# Patient Record
Sex: Female | Born: 1970 | Race: White | Hispanic: No | Marital: Married | Smoking: Never smoker
Health system: Southern US, Community
[De-identification: ages and names within clinical notes are randomized; demographics above are authoritative.]

## PROBLEM LIST (undated history)

## (undated) DIAGNOSIS — R112 Nausea with vomiting, unspecified: Secondary | ICD-10-CM

## (undated) DIAGNOSIS — Z9889 Other specified postprocedural states: Secondary | ICD-10-CM

## (undated) DIAGNOSIS — I10 Essential (primary) hypertension: Secondary | ICD-10-CM

## (undated) DIAGNOSIS — R011 Cardiac murmur, unspecified: Secondary | ICD-10-CM

---

## 2016-08-01 DIAGNOSIS — I10 Essential (primary) hypertension: Secondary | ICD-10-CM | POA: Insufficient documentation

## 2017-01-17 ENCOUNTER — Other Ambulatory Visit: Payer: Self-pay | Admitting: Student

## 2017-01-17 DIAGNOSIS — Z1231 Encounter for screening mammogram for malignant neoplasm of breast: Secondary | ICD-10-CM

## 2017-01-29 ENCOUNTER — Ambulatory Visit
Admission: RE | Admit: 2017-01-29 | Discharge: 2017-01-29 | Disposition: A | Discharge status: Home / Self Care | Payer: BLUE CROSS/BLUE SHIELD | Source: Ambulatory Visit | Attending: Student | Admitting: Student

## 2017-01-29 ENCOUNTER — Encounter: Payer: Self-pay | Admitting: Radiology

## 2017-01-29 DIAGNOSIS — Z1231 Encounter for screening mammogram for malignant neoplasm of breast: Secondary | ICD-10-CM | POA: Insufficient documentation

## 2018-04-09 ENCOUNTER — Other Ambulatory Visit: Payer: Self-pay | Admitting: Student

## 2018-04-09 DIAGNOSIS — Z1231 Encounter for screening mammogram for malignant neoplasm of breast: Secondary | ICD-10-CM

## 2018-04-29 ENCOUNTER — Ambulatory Visit
Admission: RE | Admit: 2018-04-29 | Discharge: 2018-04-29 | Disposition: A | Discharge status: Home / Self Care | Payer: BC Managed Care – PPO | Source: Ambulatory Visit | Attending: Student | Admitting: Student

## 2018-04-29 DIAGNOSIS — Z1231 Encounter for screening mammogram for malignant neoplasm of breast: Secondary | ICD-10-CM | POA: Diagnosis not present

## 2018-04-30 ENCOUNTER — Other Ambulatory Visit: Payer: Self-pay | Admitting: Student

## 2018-04-30 DIAGNOSIS — R928 Other abnormal and inconclusive findings on diagnostic imaging of breast: Secondary | ICD-10-CM

## 2018-04-30 DIAGNOSIS — N6320 Unspecified lump in the left breast, unspecified quadrant: Secondary | ICD-10-CM

## 2018-05-28 ENCOUNTER — Ambulatory Visit: Payer: BC Managed Care – PPO | Attending: Student

## 2018-05-28 ENCOUNTER — Other Ambulatory Visit: Payer: BC Managed Care – PPO

## 2018-06-04 NOTE — Addendum Note (Signed)
Encounter addended by: Nona Dell, RT on: 06/04/2018 5:58 PM  Actions taken: Imaging Exam ended

## 2018-06-06 ENCOUNTER — Ambulatory Visit
Admission: RE | Admit: 2018-06-06 | Discharge: 2018-06-06 | Disposition: A | Discharge status: Home / Self Care | Payer: BC Managed Care – PPO | Source: Ambulatory Visit | Attending: Student | Admitting: Student

## 2018-06-06 DIAGNOSIS — R928 Other abnormal and inconclusive findings on diagnostic imaging of breast: Secondary | ICD-10-CM

## 2018-06-06 DIAGNOSIS — N6320 Unspecified lump in the left breast, unspecified quadrant: Secondary | ICD-10-CM | POA: Insufficient documentation

## 2018-06-09 ENCOUNTER — Other Ambulatory Visit: Payer: Self-pay | Admitting: Student

## 2018-06-09 DIAGNOSIS — R928 Other abnormal and inconclusive findings on diagnostic imaging of breast: Secondary | ICD-10-CM

## 2018-06-09 DIAGNOSIS — N6320 Unspecified lump in the left breast, unspecified quadrant: Secondary | ICD-10-CM

## 2018-06-17 ENCOUNTER — Ambulatory Visit
Admission: RE | Admit: 2018-06-17 | Discharge: 2018-06-17 | Disposition: A | Discharge status: Home / Self Care | Payer: BC Managed Care – PPO | Source: Ambulatory Visit | Attending: Student | Admitting: Student

## 2018-06-17 DIAGNOSIS — N6320 Unspecified lump in the left breast, unspecified quadrant: Secondary | ICD-10-CM | POA: Diagnosis present

## 2018-06-17 DIAGNOSIS — R928 Other abnormal and inconclusive findings on diagnostic imaging of breast: Secondary | ICD-10-CM

## 2018-06-17 HISTORY — PX: BREAST BIOPSY: SHX20

## 2018-06-18 LAB — SURGICAL PATHOLOGY

## 2018-07-21 ENCOUNTER — Ambulatory Visit: Payer: Self-pay | Admitting: Surgery

## 2018-07-21 NOTE — H&P (Signed)
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 female who was referred by Lendon Colonel, PA for evaluation of above. Change was noted on last screening mammogram. Patient does routinely do self breast exams.  Patient has not noted a change on breast exam. Age of menarche was 27. Last menstrual period was 06/14/18. Patient admits to [facility] use. Patient is G3P3.  Patient did breast feed. Patient denies nipple discharge. Patient denies previous breast biopsy. Patient denies a personal history of breast cancer.   Past Medical History:  has a past medical history of Hypertension.  Past Surgical History:  has a past surgical history that includes Cesarean section and Tubal ligation.  Family History: family history includes Alcohol abuse in her father and paternal grandfather; Dementia in her maternal grandmother; High blood pressure (Hypertension) in her father, maternal grandfather, maternal grandmother, mother, paternal grandfather, paternal grandmother, and sister; No Known Problems in her daughter, sister, son, and son.  Social History:  reports that she has never smoked. She has never used smokeless tobacco. She reports that she drinks about 0.6 oz of alcohol per week. She reports that she does not use drugs.  Current Medications: has a current medication list which includes the following prescription(s): lisinopril-hydrochlorothiazide and melatonin.  Allergies:     Allergies as of 07/07/2018  . (No Known Allergies)    ROS:  A 15 point review of systems was performed and was negative except as noted in HPI   Objective:   BP (!) 149/90   Pulse 78   Temp 36.4 C (97.6 F) (Oral)   Ht 167.6 cm ('5\' 6"'$ )   Wt 97.1 kg (214 lb)   BMI 34.54 kg/m   Constitutional :  alert, appears stated age, cooperative and no distress  Lymphatics/Throat:  no asymmetry, masses, or scars  Respiratory:  clear to auscultation bilaterally  Cardiovascular:  regular  rate and rhythm  Gastrointestinal: soft, non-tender; bowel sounds normal; no masses,  no organomegaly.   Musculoskeletal: Steady gait and movement  Skin: Cool and moist,   Psychiatric: Normal affect, non-agitated, not confused  Breast:  Chaperone present for exam.  breasts appear normal, no suspicious masses, no skin or nipple changes or axillary nodes.    LABS/RADS:  Surgical pathology (06/17/2018 1:38 PM EDT)       Surgical pathology (06/17/2018 1:38 PM EDT)  Component Value Ref Range Performed At Pathologist Signature  SURGICAL PATHOLOGY Surgical Pathology CASE: [id number] PATIENT: Lisa Roach Surgical Pathology Report     SPECIMEN SUBMITTED: A. Breast, left, 3 o'clock 1 cm from nipple; biopsy  CLINICAL HISTORY: Screening detected mixed cystic and sold mass involving the outer subareolar left breast at the 3:00 location.The solid component (sampled today) measures approx. 1.4 cm.The entire mass measures approx. 2.6 cm  PRE-OPERATIVE DIAGNOSIS: Papilloma vs papillary cancer vs mucinous lesion vs FCC with complex cysts vs DCIS/IMC  POST-OPERATIVE DIAGNOSIS: None provided.     DIAGNOSIS: A. BREAST, LEFT, OUTER SUBAREOLAR, 3 O'CLOCK 1 CM FROM NIPPLE; ULTRASOUND-GUIDED CORE BIOPSY: - SCLEROSING INTRADUCTAL PROLIFERATION WITH PAPILLOMATOUS FEATURES, PREDOMINANT APOCRINE HYPERPLASIA, AND FOCAL USUAL DUCTAL HYPERPLASIA. - NEGATIVE FOR ATYPIA AND MALIGNANCY.   GROSS DESCRIPTION: A. Labeled: Left breast 3:00, 1 cm from nipple Received: In formalin Time/date in fixative: 1:10 PM on 06/17/2018 Cold ischemic time: Less than 1 minute Total fixation time: 7.5 hours Core pieces: Multiple Size: Aggregate, 2.2 x 0.4 x 0.1 cm Description: Yellow to pink cores and fragments Ink color:  Green Entirely submitted in one cassette.    Final Diagnosis performed by Bryan Lemma, MD. Electronically signed 06/18/2018 3:58:15PM The electronic signature indicates that the named  Attending Pathologist has evaluated the specimen Technical component performed at [facility], [address] [place], [place] [address] Lab: [phone] Dir: Rush Farmer, MD, MMMProfessional component performed at [facility], [facility], [place], [place], [place] [address] Lab: [phone] Dir: Dellia Nims. Reuel Derby, MD  [facility]    Surgical pathology (06/17/2018 1:38 PM EDT)  Specimen  Tissue - [facility] Breast biopsy        Surgical pathology (06/17/2018 1:38 PM EDT)  Performing Organization Address City/State/Zipcode Phone Number  [facility]        Imaging Results - documented in this encounter  [place] LT BREAST BX W LOC DEV 1ST LESION IMG BX SPEC US GUIDE (06/17/2018 2:23 PM EDT) [place] LT BREAST BX W LOC DEV 1ST LESION IMG BX SPEC US GUIDE (06/17/2018 2:23 PM EDT)  Specimen     [place] LT BREAST BX W LOC DEV 1ST LESION IMG BX SPEC US GUIDE (06/17/2018 2:23 PM EDT)  Addenda  Addendum by Evangeline Dakin, MD on 06/20/2018 11:57 AM  ADDENDUM REPORT: 06/20/2018 11:05    ADDENDUM:  Pathology of the left breast biopsy revealed A. BREAST, LEFT, OUTER  SUBAREOLAR, 3 O'CLOCK 1 CM FROM NIPPLE; ULTRASOUND-GUIDED CORE  BIOPSY: SCLEROSING INTRADUCTAL PROLIFERATION WITH PAPILLOMATOUS  FEATURES, PREDOMINANT APOCRINE HYPERPLASIA, AND FOCAL USUAL DUCTAL  HYPERPLASIA. NEGATIVE FOR ATYPIA AND MALIGNANCY.    This was found to be concordant by Dr. Purcell Nails.    RECOMMENDATION: Surgical excision.    Results and recommendations were relayed to [facility], nurse for  Lisa Denis, PA by phone and fax by Jetta Lout, [place]. She  stated that Lisa Denis, PA will contact her patient with the  results and arrange the referral. The patient was contacted by Jetta Lout, [place] on 06/19/18 at 5:15 PM for a post biopsy site check. The  patient stated she has done well following the biopsy with no  bleeding or pain. Post biopsy instructions were reviewed with the   patient and all of her questions were answered. She had not talked  to her provider at the time of conversation. I explained to her that  her provider prefers to discuss results and to expect a call from  her. I did inform the patient that it was benign and the  recommendation, but her provider would discuss it with her further.  She was encouraged to contact the [facility] with any  further questions or concerns.    Addendum by Jetta Lout, RRA on 06/20/18.      Electronically Signed  ByHaydee Monica M.D.  On: 06/20/2018 11:05         [place] LT BREAST BX W LOC DEV 1ST LESION IMG BX SPEC US GUIDE (06/17/2018 2:23 PM EDT)  Narrative Performed At  [place] detected mixed cystic and solid mass  involving the OUTER subareolar LEFT breast at the 3 o'clock  location. The solid component of the mass measures approximately 1.4  cm. The entire mass measures approximately 2.6 cm. The solid  component is sampled.    EXAM:  ULTRASOUND GUIDED LEFT BREAST CORE NEEDLE BIOPSY    COMPARISON:Previous exam(s).    FINDINGS:  I met with the patient and we discussed the procedure of  ultrasound-guided biopsy, including benefits and alternatives. We  discussed the high likelihood of a successful procedure. We  discussed the risks of  the procedure, including infection, bleeding,  tissue injury, clip migration, and inadequate sampling. Informed  written consent was given. The usual time-out protocol was performed  immediately prior to the procedure.    Lesion quadrant: Slight LOWER OUTER QUADRANT.    Using sterile technique with chlorhexidine as skin antisepsis, 1%  Lidocaine as local anesthetic, under direct ultrasound  visualization, a 12 gauge Bard Marquee core needle device was used  to perform biopsy of the solid component of a mixed cystic and solid  mass in the OUTER subareolar LEFT breast using a LATERAL  approach.  At the conclusion of the procedure a coil shaped tissue marker clip  was deployed into the biopsy cavity. Follow up 2 view mammogram was  performed and dictated separately.    IMPRESSION:  Ultrasound guided biopsy of the solid component of a mixed cystic  and solid mass in the OUTER subareolar LEFT breast. No apparent  complications.    Electronically Signed:  ByHaydee Monica M.D.  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 The risks of general anesthetic, if used, includes MI, CVA, sudden death or even reaction to anesthetic medications also discussed.  Typical post-op recovery time and possbility of activity restrictions were also discussed.  Alternatives include continued observation.  Benefits include possible symptom relief, pathologic evaluation, and/or curative excision.   The patient verbalized understanding and all questions were answered to the patient's satisfaction.  2. We discussed the possibility of non-biopsied DCIS, atypia, or more serious malignancies within the area of concern on mammography.  Current guideline recommendation is proceeding with a full excisional biopsy of the suspected area but there is some emerging literature saying close follow-up may be appropriate for certain individuals.  After extensive discussion of such, patient specifically stated that cost is a major factor for her and deciding whether or not to proceed with surgery.  She requested that preliminary numbers for cost versus continued surveillance is collected prior to making final decision.  We provided her with the CPT codes necessary for her to inquire with her insurance company.   She will follow-up with [place] when she makes a final decision regarding surveillance versus proceeding with excisional biopsy.      Electronically signed by Benjamine Sprague, DO on 07/08/2018 9:21 PM

## 2018-07-22 ENCOUNTER — Other Ambulatory Visit: Payer: Self-pay | Admitting: Surgery

## 2018-07-22 DIAGNOSIS — D242 Benign neoplasm of left breast: Secondary | ICD-10-CM

## 2018-08-14 ENCOUNTER — Encounter
Admission: RE | Admit: 2018-08-14 | Discharge: 2018-08-14 | Disposition: A | Discharge status: Home / Self Care | Payer: BC Managed Care – PPO | Source: Ambulatory Visit | Attending: Surgery | Admitting: Surgery

## 2018-08-14 ENCOUNTER — Other Ambulatory Visit: Payer: Self-pay

## 2018-08-14 HISTORY — DX: Essential (primary) hypertension: I10

## 2018-08-14 HISTORY — DX: Cardiac murmur, unspecified: R01.1

## 2018-08-14 HISTORY — DX: Nausea with vomiting, unspecified: Z98.890

## 2018-08-14 HISTORY — DX: Nausea with vomiting, unspecified: R11.2

## 2018-08-14 NOTE — Patient Instructions (Addendum)
  Your procedure is scheduled on: Thursday August 28, 2018 @ 7:45 am Report to [facility]  Remember: Instructions that are not followed completely may result in serious medical risk, up to and including death, or upon the discretion of your surgeon and anesthesiologist your surgery may need to be rescheduled.    __x__ 1. Do not eat food (including mints, candies, chewing gum) after midnight the night before your procedure. You may drink clear liquids up to 2 hours before you are scheduled to arrive at the hospital for your procedure.  Do not drink anything within 2 hours of your scheduled arrival to the hospital.  Approved clear liquids:  --Water or Apple juice without pulp  --Clear carbohydrate beverage such as Gatorade or Powerade  --Black Coffee or Clear Tea (No milk, no creamers, do not add anything to the coffee or tea)    __x__ 2. No Alcohol for 24 hours before or after surgery.   __x__ 3. No Smoking or e-cigarettes for 24 hours before surgery.  Do not use any chewable tobacco products for at least 6 hours before surgery.   __x__ 4. Notify your doctor if there is any change in your medical condition (cold, fever, infections).   __x__ 5. On the morning of surgery brush your teeth with toothpaste and water.  You may rinse your mouth with mouthwash if you wish.  Do not swallow any toothpaste or mouthwash.  Please read over the following fact sheets that you were given:   [facility] Preparing for Surgery and/or MRSA Information    __x__ Use CHG Soap or Sage wipes as directed on instruction sheet.   Do not wear jewelry, make-up, hairpins, clips or nail polish on the day of surgery.  Do not wear lotions, powders, deodorant, or perfumes.   Do not shave below the face/neck 48 hours prior to surgery.   Do not bring valuables to the hospital.    [facility] is not responsible for any belongings or valuables.               Contacts, dentures or bridgework may not be worn  into surgery.  For patients discharged on the day of surgery, you will NOT be permitted to drive yourself home.  You must have a responsible adult with you for 24 hours after surgery.  __x__ Take the following medicines on the morning of surgery with a SMALL SIP OF WATER: None  __x__ Follow recommendations from Cardiologist, Pulmonologist or PCP regarding stopping Aspirin, Coumadin, Plavix, Eliquis, Effient, Pradaxa, and Pletal.  __x__ TODAY: Stop Anti-inflammatories such as Advil, Ibuprofen, Motrin, Aleve, Naproxen, Naprosyn, BC/Goodies powders or aspirin products. You may continue to take Tylenol and Celebrex.   __x__ TODAY: Stop supplements until after surgery. You may continue to take Vitamin D, Vitamin B, and multivitamin.

## 2018-08-21 ENCOUNTER — Encounter
Admission: RE | Admit: 2018-08-21 | Discharge: 2018-08-21 | Disposition: A | Discharge status: Home / Self Care | Payer: BC Managed Care – PPO | Source: Ambulatory Visit | Attending: Surgery | Admitting: Surgery

## 2018-08-21 DIAGNOSIS — Z01818 Encounter for other preprocedural examination: Secondary | ICD-10-CM | POA: Insufficient documentation

## 2018-08-21 DIAGNOSIS — D242 Benign neoplasm of left breast: Secondary | ICD-10-CM | POA: Diagnosis not present

## 2018-08-21 DIAGNOSIS — R9431 Abnormal electrocardiogram [ECG] [EKG]: Secondary | ICD-10-CM | POA: Insufficient documentation

## 2018-08-21 DIAGNOSIS — I10 Essential (primary) hypertension: Secondary | ICD-10-CM | POA: Insufficient documentation

## 2018-08-21 LAB — CBC
HCT: 36.4 % (ref 36.0–46.0)
Hemoglobin: 12.2 g/dL (ref 12.0–15.0)
MCH: 29 pg (ref 26.0–34.0)
MCHC: 33.5 g/dL (ref 30.0–36.0)
MCV: 86.7 fL (ref 80.0–100.0)
PLATELETS: 352 10*3/uL (ref 150–400)
RBC: 4.2 MIL/uL (ref 3.87–5.11)
RDW: 12.1 % (ref 11.5–15.5)
WBC: 9 10*3/uL (ref 4.0–10.5)
nRBC: 0 % (ref 0.0–0.2)

## 2018-08-27 MED ORDER — CEFAZOLIN SODIUM-DEXTROSE 2-4 GM/100ML-% IV SOLN
2.0000 g | INTRAVENOUS | Status: AC
  Administered 2018-08-28: 2 g via INTRAVENOUS

## 2018-08-28 ENCOUNTER — Ambulatory Visit
Admission: RE | Admit: 2018-08-28 | Discharge: 2018-08-28 | Disposition: A | Discharge status: Home / Self Care | Payer: BC Managed Care – PPO | Source: Ambulatory Visit | Attending: Surgery | Admitting: Surgery

## 2018-08-28 ENCOUNTER — Ambulatory Visit: Payer: BC Managed Care – PPO | Admitting: Anesthesiology

## 2018-08-28 ENCOUNTER — Encounter
Admission: RE | Disposition: A | Discharge status: Home / Self Care | Payer: Self-pay | Source: Ambulatory Visit | Attending: Surgery

## 2018-08-28 DIAGNOSIS — D241 Benign neoplasm of right breast: Secondary | ICD-10-CM

## 2018-08-28 DIAGNOSIS — D242 Benign neoplasm of left breast: Secondary | ICD-10-CM | POA: Diagnosis not present

## 2018-08-28 DIAGNOSIS — N6082 Other benign mammary dysplasias of left breast: Secondary | ICD-10-CM | POA: Diagnosis not present

## 2018-08-28 DIAGNOSIS — Z79899 Other long term (current) drug therapy: Secondary | ICD-10-CM | POA: Diagnosis not present

## 2018-08-28 DIAGNOSIS — I10 Essential (primary) hypertension: Secondary | ICD-10-CM | POA: Insufficient documentation

## 2018-08-28 HISTORY — PX: BREAST LUMPECTOMY WITH NEEDLE LOCALIZATION: SHX5759

## 2018-08-28 HISTORY — PX: BREAST LUMPECTOMY: SHX2

## 2018-08-28 LAB — POCT PREGNANCY, URINE: Preg Test, Ur: NEGATIVE

## 2018-08-28 LAB — POCT I-STAT 4, (NA,K, GLUC, HGB,HCT)
GLUCOSE: 117 mg/dL — AB (ref 70–99)
HEMATOCRIT: 37 % (ref 36.0–46.0)
HEMOGLOBIN: 12.6 g/dL (ref 12.0–15.0)
POTASSIUM: 3.4 mmol/L — AB (ref 3.5–5.1)
Sodium: 140 mmol/L (ref 135–145)

## 2018-08-28 SURGERY — BREAST LUMPECTOMY WITH NEEDLE LOCALIZATION
Anesthesia: General | Site: Breast | Laterality: Left

## 2018-08-28 MED ORDER — FENTANYL CITRATE (PF) 100 MCG/2ML IJ SOLN
INTRAMUSCULAR | Status: DC | PRN
  Administered 2018-08-28 (×2): 50 ug via INTRAVENOUS

## 2018-08-28 MED ORDER — IBUPROFEN 800 MG PO TABS: 800.0000 mg | ORAL_TABLET | Freq: Three times a day (TID) | ORAL | 0 refills | Status: DC | PRN

## 2018-08-28 MED ORDER — FAMOTIDINE 20 MG PO TABS
ORAL_TABLET | ORAL | Status: AC
  Administered 2018-08-28: 20 mg via ORAL
  Filled 2018-08-28: qty 1

## 2018-08-28 MED ORDER — LIDOCAINE HCL (PF) 1 % IJ SOLN
INTRAMUSCULAR | Status: AC
  Filled 2018-08-28: qty 30

## 2018-08-28 MED ORDER — OXYCODONE HCL 5 MG/5ML PO SOLN: 5.0000 mg | Freq: Once | ORAL | Status: DC | PRN

## 2018-08-28 MED ORDER — GABAPENTIN 300 MG PO CAPS
ORAL_CAPSULE | ORAL | Status: AC
  Administered 2018-08-28: 300 mg via ORAL
  Filled 2018-08-28: qty 1

## 2018-08-28 MED ORDER — ONDANSETRON HCL 4 MG/2ML IJ SOLN
INTRAMUSCULAR | Status: DC | PRN
  Administered 2018-08-28: 4 mg via INTRAVENOUS

## 2018-08-28 MED ORDER — LIDOCAINE HCL (CARDIAC) PF 100 MG/5ML IV SOSY
PREFILLED_SYRINGE | INTRAVENOUS | Status: DC | PRN
  Administered 2018-08-28: 80 mg via INTRAVENOUS

## 2018-08-28 MED ORDER — PROPOFOL 10 MG/ML IV BOLUS
INTRAVENOUS | Status: DC | PRN
  Administered 2018-08-28: 150 mg via INTRAVENOUS

## 2018-08-28 MED ORDER — MIDAZOLAM HCL 5 MG/5ML IJ SOLN
INTRAMUSCULAR | Status: AC
  Filled 2018-08-28: qty 5

## 2018-08-28 MED ORDER — BUPIVACAINE-EPINEPHRINE (PF) 0.5% -1:200000 IJ SOLN
INTRAMUSCULAR | Status: AC
  Filled 2018-08-28: qty 30

## 2018-08-28 MED ORDER — GABAPENTIN 300 MG PO CAPS
300.0000 mg | ORAL_CAPSULE | ORAL | Status: AC
  Administered 2018-08-28: 300 mg via ORAL

## 2018-08-28 MED ORDER — PROMETHAZINE HCL 25 MG/ML IJ SOLN: 12.5000 mg | INTRAMUSCULAR | Status: DC | PRN

## 2018-08-28 MED ORDER — CELECOXIB 200 MG PO CAPS
200.0000 mg | ORAL_CAPSULE | ORAL | Status: AC
  Administered 2018-08-28: 200 mg via ORAL

## 2018-08-28 MED ORDER — ACETAMINOPHEN 325 MG PO TABS: 650.0000 mg | ORAL_TABLET | Freq: Three times a day (TID) | ORAL | 0 refills | Status: AC | PRN

## 2018-08-28 MED ORDER — DEXAMETHASONE SODIUM PHOSPHATE 10 MG/ML IJ SOLN
INTRAMUSCULAR | Status: DC | PRN
  Administered 2018-08-28: 5 mg via INTRAVENOUS

## 2018-08-28 MED ORDER — LACTATED RINGERS IV SOLN
INTRAVENOUS | Status: DC
  Administered 2018-08-28: 09:00:00 via INTRAVENOUS

## 2018-08-28 MED ORDER — FENTANYL CITRATE (PF) 100 MCG/2ML IJ SOLN
INTRAMUSCULAR | Status: AC
  Filled 2018-08-28: qty 2

## 2018-08-28 MED ORDER — LIDOCAINE-EPINEPHRINE (PF) 1 %-1:200000 IJ SOLN
INTRAMUSCULAR | Status: DC | PRN
  Administered 2018-08-28: 10 mL

## 2018-08-28 MED ORDER — FENTANYL CITRATE (PF) 100 MCG/2ML IJ SOLN: 25.0000 ug | INTRAMUSCULAR | Status: DC | PRN

## 2018-08-28 MED ORDER — ONDANSETRON HCL 4 MG/2ML IJ SOLN
INTRAMUSCULAR | Status: AC
  Filled 2018-08-28: qty 2

## 2018-08-28 MED ORDER — DEXAMETHASONE SODIUM PHOSPHATE 10 MG/ML IJ SOLN
INTRAMUSCULAR | Status: AC
  Filled 2018-08-28: qty 1

## 2018-08-28 MED ORDER — BUPIVACAINE HCL 0.5 % IJ SOLN
INTRAMUSCULAR | Status: DC | PRN
  Administered 2018-08-28: 10 mL

## 2018-08-28 MED ORDER — ACETAMINOPHEN 500 MG PO TABS
ORAL_TABLET | ORAL | Status: AC
  Administered 2018-08-28: 1000 mg via ORAL
  Filled 2018-08-28: qty 2

## 2018-08-28 MED ORDER — EPHEDRINE SULFATE 50 MG/ML IJ SOLN
INTRAMUSCULAR | Status: DC | PRN
  Administered 2018-08-28 (×4): 5 mg via INTRAVENOUS

## 2018-08-28 MED ORDER — SCOPOLAMINE 1 MG/3DAYS TD PT72
1.0000 | MEDICATED_PATCH | Freq: Once | TRANSDERMAL | Status: DC
  Administered 2018-08-28: 1.5 mg via TRANSDERMAL

## 2018-08-28 MED ORDER — HYDROCODONE-ACETAMINOPHEN 5-325 MG PO TABS: 1.0000 | ORAL_TABLET | Freq: Four times a day (QID) | ORAL | 0 refills | Status: AC | PRN

## 2018-08-28 MED ORDER — MIDAZOLAM HCL 2 MG/2ML IJ SOLN
INTRAMUSCULAR | Status: DC | PRN
  Administered 2018-08-28: 2 mg via INTRAVENOUS

## 2018-08-28 MED ORDER — PROPOFOL 10 MG/ML IV BOLUS
INTRAVENOUS | Status: AC
  Filled 2018-08-28: qty 20

## 2018-08-28 MED ORDER — ACETAMINOPHEN 500 MG PO TABS
1000.0000 mg | ORAL_TABLET | ORAL | Status: AC
  Administered 2018-08-28: 1000 mg via ORAL

## 2018-08-28 MED ORDER — DOCUSATE SODIUM 100 MG PO CAPS: 100.0000 mg | ORAL_CAPSULE | Freq: Two times a day (BID) | ORAL | 0 refills | Status: AC | PRN

## 2018-08-28 MED ORDER — CELECOXIB 200 MG PO CAPS
ORAL_CAPSULE | ORAL | Status: AC
  Administered 2018-08-28: 200 mg via ORAL
  Filled 2018-08-28: qty 1

## 2018-08-28 MED ORDER — CEFAZOLIN SODIUM-DEXTROSE 2-4 GM/100ML-% IV SOLN
INTRAVENOUS | Status: AC
  Filled 2018-08-28: qty 100

## 2018-08-28 MED ORDER — OXYCODONE HCL 5 MG PO TABS: 5.0000 mg | ORAL_TABLET | Freq: Once | ORAL | Status: DC | PRN

## 2018-08-28 MED ORDER — SCOPOLAMINE 1 MG/3DAYS TD PT72
MEDICATED_PATCH | TRANSDERMAL | Status: AC
  Administered 2018-08-28: 1.5 mg via TRANSDERMAL
  Filled 2018-08-28: qty 1

## 2018-08-28 MED ORDER — CHLORHEXIDINE GLUCONATE CLOTH 2 % EX PADS
6.0000 | MEDICATED_PAD | Freq: Once | CUTANEOUS | Status: AC
  Administered 2018-08-28: 6 via TOPICAL

## 2018-08-28 MED ORDER — FAMOTIDINE 20 MG PO TABS
20.0000 mg | ORAL_TABLET | Freq: Once | ORAL | Status: AC
  Administered 2018-08-28: 20 mg via ORAL

## 2018-08-28 SURGICAL SUPPLY — 44 items
APPLIER CLIP 11 MED OPEN (CLIP)
BLADE SURG 15 STRL LF DISP TIS (BLADE) ×1 IMPLANT
BLADE SURG 15 STRL SS (BLADE) ×2
CANISTER SUCT 1200ML W/VALVE (MISCELLANEOUS) ×3 IMPLANT
CHLORAPREP W/TINT 26ML (MISCELLANEOUS) ×3 IMPLANT
CLIP APPLIE 11 MED OPEN (CLIP) IMPLANT
CNTNR SPEC 2.5X3XGRAD LEK (MISCELLANEOUS) ×1
CONT SPEC 4OZ STER OR WHT (MISCELLANEOUS) ×2
CONTAINER SPEC 2.5X3XGRAD LEK (MISCELLANEOUS) ×1 IMPLANT
COVER WAND RF STERILE (DRAPES) ×3 IMPLANT
DERMABOND ADVANCED (GAUZE/BANDAGES/DRESSINGS) ×2
DERMABOND ADVANCED .7 DNX12 (GAUZE/BANDAGES/DRESSINGS) ×1 IMPLANT
DEVICE DUBIN SPECIMEN MAMMOGRA (MISCELLANEOUS) ×3 IMPLANT
DRAPE CHEST BREAST 77X106 FENE (MISCELLANEOUS) IMPLANT
DRAPE LAPAROTOMY 77X122 PED (DRAPES) ×3 IMPLANT
DRAPE SHEET LG 3/4 BI-LAMINATE (DRAPES) ×3 IMPLANT
ELECT CAUTERY BLADE TIP 2.5 (TIP) ×3
ELECT REM PT RETURN 9FT ADLT (ELECTROSURGICAL) ×3
ELECTRODE CAUTERY BLDE TIP 2.5 (TIP) ×1 IMPLANT
ELECTRODE REM PT RTRN 9FT ADLT (ELECTROSURGICAL) ×1 IMPLANT
GAUZE SPONGE 4X4 12PLY STRL (GAUZE/BANDAGES/DRESSINGS) ×3 IMPLANT
GLOVE BIOGEL PI IND STRL 7.0 (GLOVE) ×1 IMPLANT
GLOVE BIOGEL PI INDICATOR 7.0 (GLOVE) ×2
GLOVE SURG SYN 7.0 (GLOVE) ×6 IMPLANT
GOWN STRL REUS W/ TWL LRG LVL3 (GOWN DISPOSABLE) ×3 IMPLANT
GOWN STRL REUS W/TWL LRG LVL3 (GOWN DISPOSABLE) ×6
JACKSON PRATT 10 (INSTRUMENTS) IMPLANT
KIT TURNOVER KIT A (KITS) ×3 IMPLANT
LABEL OR SOLS (LABEL) ×3 IMPLANT
LIGHT WAVEGUIDE WIDE FLAT (MISCELLANEOUS) IMPLANT
NEEDLE HYPO 25X1 1.5 SAFETY (NEEDLE) ×6 IMPLANT
PACK BASIN MINOR ARMC (MISCELLANEOUS) ×3 IMPLANT
SUT MNCRL 4-0 (SUTURE) ×4
SUT MNCRL 4-0 27XMFL (SUTURE) ×2
SUT SILK 2 0 (SUTURE) ×2
SUT SILK 2-0 30XBRD TIE 12 (SUTURE) ×1 IMPLANT
SUT SILK 3 0 12 30 (SUTURE) ×3 IMPLANT
SUT VIC AB 3-0 SH 27 (SUTURE) ×4
SUT VIC AB 3-0 SH 27X BRD (SUTURE) ×2 IMPLANT
SUTURE MNCRL 4-0 27XMF (SUTURE) ×2 IMPLANT
SYR 10ML LL (SYRINGE) ×3 IMPLANT
SYR 50ML LL SCALE MARK (SYRINGE) IMPLANT
SYR BULB IRRIG 60ML STRL (SYRINGE) ×3 IMPLANT
WATER STERILE IRR 1000ML POUR (IV SOLUTION) ×3 IMPLANT

## 2018-08-28 NOTE — Op Note (Signed)
Preoperative diagnosis:  Left breast papilloma.  Postoperative diagnosis: Left breast papilloma.   Procedure: needle-localized left breast lumpectomy.   Anesthesia: GETA  Surgeon: Dr. Benjamine Sprague  Wound Classification: Clean  Indications: Patient is a 47 y.o. female with a left breast mass noted on mammography with core biopsy demonstrating papilloma requiring needle-localized lumpectomy  Specimen: left Breast mass  Complications: None  Estimated Blood Loss: 15mL  Findings: 1. Specimen mammography shows marker and wire on specimen 2. Pathology call refers gross examination of margins was negative    Description of procedure: Preoperative needle localization was performed by radiology. Localization studies were reviewed. The patient was taken to the operating room and placed supine on the operating table, and after general anesthesia the left breast  prepped and draped in the usual sterile fashion. A time-out was completed verifying correct patient, procedure, site, positioning, and implant(s) and/or special equipment prior to beginning this procedure.  By comparing the localization studies with the direction and skin entry site of the needle, the probable trajectory and location of the mass was visualized. A circumareolar skin incision was planned in such a way as to minimize the amount of dissection to reach the mass.  The skin incision was made after infusion of local. Flaps were raised and the location of the wire confirmed. The wire was delivered into the wound. Sharp and blunt dissection was then taken down to the mass within breast tissue, measuring 3cm x 3cm x 2.5cm, taking care to include the entire localizing needle and a margin of grossly normal tissue. The specimen and entire localizing wire were removed. The specimen was oriented with long lateral, short superior, deep double sutures and sent to radiology with the localization studies. Confirmation was received that the entire  target lesion had been resected.  Wound irrigated, hemostasis was achieved and the wound closed in layers with  interrupted sutures of 3-0 Vicryl in deep dermal layer and a running subcuticular suture of Monocryl 4-0, then dressed with dermabond.  The patient tolerated the procedure well and was taken to the postanesthesia care unit in stable condition. Sponge and instrument count correct at end of procedure.

## 2018-08-28 NOTE — Anesthesia Procedure Notes (Signed)
Procedure Name: LMA Insertion Date/Time: 08/28/2018 10:35 AM Performed by: Hedda Slade, CRNA Pre-anesthesia Checklist: Patient identified, Patient being monitored, Timeout performed, Emergency Drugs available and Suction available Patient Re-evaluated:Patient Re-evaluated prior to induction Oxygen Delivery Method: Circle system utilized Preoxygenation: Pre-oxygenation with 100% oxygen Induction Type: IV induction Ventilation: Mask ventilation without difficulty LMA: LMA inserted LMA Size: 3.5 Tube type: Oral Number of attempts: 1 Placement Confirmation: positive ETCO2 and breath sounds checked- equal and bilateral Tube secured with: Tape Dental Injury: Teeth and Oropharynx as per pre-operative assessment

## 2018-08-28 NOTE — H&P (Signed)
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 female who was referred by Lendon Colonel, PA for evaluation of above. Change was noted on last screening mammogram. Patient does routinely do self breast exams. Patient has not noted a change on breast exam. Age of menarche was 9. Last menstrual period was 06/14/18. Patient admits to [facility] use. Patient is G3P3. Patient did breast feed. Patient denies nipple discharge. Patient denies previous breast biopsy. Patient denies a personal history of breast cancer.  Past Medical History: has a past medical history of Hypertension.  Past Surgical History: has a past surgical history that includes Cesarean section and Tubal ligation.  Family History: family history includes Alcohol abuse in her father and paternal grandfather; Dementia in her maternal grandmother; High blood pressure (Hypertension) in her father, maternal grandfather, maternal grandmother, mother, paternal grandfather, paternal grandmother, and sister; No Known Problems in her daughter, sister, son, and son.  Social History: reports that she has never smoked. She has never used smokeless tobacco. She reports that she drinks about 0.6 oz of alcohol per week. She reports that she does not use drugs.  Current Medications: has a current medication list which includes the following prescription(s): lisinopril-hydrochlorothiazide and melatonin.  Allergies:  Allergies as of 07/07/2018  . (No Known Allergies)   ROS:  A 15 point review of systems was performed and was negative except as noted in HPI  Objective:    BP (!) 149/90  Pulse 78  Temp 36.4 C (97.6 F) (Oral)  Ht 167.6 cm ('5\' 6"'$ )  Wt 97.1 kg (214 lb)  BMI 34.54 kg/m   Constitutional : alert, appears stated age, cooperative and no distress  Lymphatics/Throat: no asymmetry, masses, or scars  Respiratory: clear to auscultation bilaterally  Cardiovascular: regular rate and rhythm   Gastrointestinal: soft, non-tender; bowel sounds normal; no masses, no organomegaly.  Musculoskeletal: Steady gait and movement  Skin: Cool and moist,  Psychiatric: Normal affect, non-agitated, not confused  Breast: Chaperone present for exam. breasts appear normal, no suspicious masses, no skin or nipple changes or axillary nodes.   LABS/RADS:  Surgical pathology (06/17/2018 1:38 PM EDT) Surgical pathology (06/17/2018 1:38 PM EDT)  Component Value Ref Range Performed At Pathologist Signature  SURGICAL PATHOLOGY Surgical Pathology CASE: [id number] PATIENT: Lisa Roach Surgical Pathology Report  SPECIMEN SUBMITTED: A. Breast, left, 3 o'clock 1 cm from nipple; biopsy  CLINICAL HISTORY: Screening detected mixed cystic and sold mass involving the outer subareolar left breast at the 3:00 location.The solid component (sampled today) measures approx. 1.4 cm.The entire mass measures approx. 2.6 cm  PRE-OPERATIVE DIAGNOSIS: Papilloma vs papillary cancer vs mucinous lesion vs FCC with complex cysts vs DCIS/IMC  POST-OPERATIVE DIAGNOSIS: None provided.  DIAGNOSIS: A. BREAST, LEFT, OUTER SUBAREOLAR, 3 O'CLOCK 1 CM FROM NIPPLE; ULTRASOUND-GUIDED CORE BIOPSY: - SCLEROSING INTRADUCTAL PROLIFERATION WITH PAPILLOMATOUS FEATURES, PREDOMINANT APOCRINE HYPERPLASIA, AND FOCAL USUAL DUCTAL HYPERPLASIA. - NEGATIVE FOR ATYPIA AND MALIGNANCY.  GROSS DESCRIPTION: A. Labeled: Left breast 3:00, 1 cm from nipple Received: In formalin Time/date in fixative: 1:10 PM on 06/17/2018 Cold ischemic time: Less than 1 minute Total fixation time: 7.5 hours Core pieces: Multiple Size: Aggregate, 2.2 x 0.4 x 0.1 cm Description: Yellow to pink cores and fragments Ink color: Green Entirely submitted in one cassette.  Final Diagnosis performed by Bryan Lemma, MD. Electronically signed 06/18/2018 3:58:15PM The electronic signature indicates that the named Attending Pathologist has evaluated the  specimen Technical component performed at [place], [place]  [address] [place], [place] [address] Lab: [phone] Dir: Rush Farmer, MD, MMMProfessional component performed at [facility], [facility], [place], [place], [place] [address] Lab: [phone] Dir: Dellia Nims. Reuel Derby, MD  [facility]    Surgical pathology (06/17/2018 1:38 PM EDT)  Specimen  Tissue - [facility] Breast biopsy   Surgical pathology (06/17/2018 1:38 PM EDT)  Performing Organization Address City/State/Zipcode Phone Number  [facility]     Imaging Results - documented in this encounter  [place] LT BREAST BX W LOC DEV 1ST LESION IMG BX SPEC US GUIDE (06/17/2018 2:23 PM EDT) [place] LT BREAST BX W LOC DEV 1ST LESION IMG BX SPEC US GUIDE (06/17/2018 2:23 PM EDT)  Specimen     [place] LT BREAST BX W LOC DEV 1ST LESION IMG BX SPEC US GUIDE (06/17/2018 2:23 PM EDT)  Addenda  Addendum by Evangeline Dakin, MD on 06/20/2018 11:57 AM  ADDENDUM REPORT: 06/20/2018 11:05    ADDENDUM:  Pathology of the left breast biopsy revealed A. BREAST, LEFT, OUTER  SUBAREOLAR, 3 O'CLOCK 1 CM FROM NIPPLE; ULTRASOUND-GUIDED CORE  BIOPSY: SCLEROSING INTRADUCTAL PROLIFERATION WITH PAPILLOMATOUS  FEATURES, PREDOMINANT APOCRINE HYPERPLASIA, AND FOCAL USUAL DUCTAL  HYPERPLASIA. NEGATIVE FOR ATYPIA AND MALIGNANCY.    This was found to be concordant by Dr. Purcell Nails.    RECOMMENDATION: Surgical excision.    Results and recommendations were relayed to [facility], nurse for  Wayland Denis, PA by phone and fax by Jetta Lout, [place]. She  stated that Wayland Denis, PA will contact her patient with the  results and arrange the referral. The patient was contacted by Jetta Lout, [place] on 06/19/18 at 5:15 PM for a post biopsy site check. The  patient stated she has done well following the biopsy with no  bleeding or pain. Post biopsy instructions were reviewed with the  patient and all of her questions were answered. She had  not talked  to her provider at the time of conversation. I explained to her that  her provider prefers to discuss results and to expect a call from  her. I did inform the patient that it was benign and the  recommendation, but her provider would discuss it with her further.  She was encouraged to contact the [facility] with any  further questions or concerns.    Addendum by Jetta Lout, RRA on 06/20/18.      Electronically Signed  ByHaydee Monica M.D.  On: 06/20/2018 11:05     [place] LT BREAST BX W LOC DEV 1ST LESION IMG BX SPEC US GUIDE (06/17/2018 2:23 PM EDT)  Narrative Performed At  [facility] detected mixed cystic and solid mass  involving the OUTER subareolar LEFT breast at the 3 o'clock  location. The solid component of the mass measures approximately 1.4  cm. The entire mass measures approximately 2.6 cm. The solid  component is sampled.    EXAM:  ULTRASOUND GUIDED LEFT BREAST CORE NEEDLE BIOPSY    COMPARISON:Previous exam(s).    FINDINGS:  I met with the patient and we discussed the procedure of  ultrasound-guided biopsy, including benefits and alternatives. We  discussed the high likelihood of a successful procedure. We  discussed the risks of the procedure, including infection, bleeding,  tissue injury, clip migration, and inadequate sampling. Informed  written consent was given. The usual time-out protocol was performed  immediately prior to the procedure.    Lesion quadrant: Slight LOWER OUTER QUADRANT.    Using sterile technique with chlorhexidine as skin antisepsis, 1%  Lidocaine as local anesthetic, under direct ultrasound  visualization, a 12 gauge Bard Marquee core needle device was used  to perform biopsy of the solid component of a mixed cystic and solid  mass in the OUTER subareolar LEFT breast using a LATERAL approach.  At the conclusion of the procedure a coil shaped tissue  marker clip  was deployed into the biopsy cavity. Follow up 2 view mammogram was  performed and dictated separately.    IMPRESSION:  Ultrasound guided biopsy of the solid component of a mixed cystic  and solid mass in the OUTER subareolar LEFT breast. No apparent  complications.    Electronically Signed:  ByHaydee Monica M.D.  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 The risks of general anesthetic, if used, includes MI, CVA, sudden death or even reaction to anesthetic medications also discussed.  Typical post-op recovery time and possbility of activity restrictions were also discussed. Alternatives include continued observation. Benefits include possible symptom relief, pathologic evaluation, and/or curative excision.   The patient verbalized understanding and all questions were answered to the patient's satisfaction.  2. We discussed the possibility of non-biopsied DCIS, atypia, or more serious malignancies within the area of concern on mammography. Current guideline recommendation is proceeding with a full excisional biopsy of the suspected area but there is some emerging literature saying close follow-up may be appropriate for certain individuals. After extensive discussion of such, patient eventually decided to proceed with surgical excision.

## 2018-08-28 NOTE — Interval H&P Note (Signed)
History and Physical Interval Note:  08/28/2018 10:05 AM  Lisa Roach  has presented today for surgery, with the diagnosis of PAPILLOMA OF BREAST  The various methods of treatment have been discussed with the patient and family. After consideration of risks, benefits and other options for treatment, the patient has consented to  Procedure(s): BREAST LUMPECTOMY WITH NEEDLE LOCALIZATION (Left) as a surgical intervention .  The patient's history has been reviewed, patient examined, no change in status, stable for surgery.  I have reviewed the patient's chart and labs.  Questions were answered to the patient's satisfaction.     [name] Lysle Pearl

## 2018-08-28 NOTE — Anesthesia Postprocedure Evaluation (Signed)
Anesthesia Post Note  Patient: Lisa Roach  Procedure(s) Performed: BREAST LUMPECTOMY WITH NEEDLE LOCALIZATION (Left Breast)  Patient location during evaluation: PACU Anesthesia Type: General Level of consciousness: awake and alert Pain management: pain level controlled Vital Signs Assessment: post-procedure vital signs reviewed and stable Respiratory status: spontaneous breathing, nonlabored ventilation and respiratory function stable Cardiovascular status: blood pressure returned to baseline and stable Postop Assessment: no apparent nausea or vomiting Anesthetic complications: no     Last Vitals:  Vitals:   08/28/18 1246 08/28/18 1335  BP: 130/64 113/77  Pulse: 67 76  Resp: 16 16  Temp: 36.4 C   SpO2: 100% 97%    Last Pain:  Vitals:   08/28/18 1246  TempSrc: Temporal  PainSc: 0-No pain                 Durenda Hurt

## 2018-08-28 NOTE — Anesthesia Post-op Follow-up Note (Signed)
Anesthesia QCDR form completed.        

## 2018-08-28 NOTE — Transfer of Care (Signed)
Immediate Anesthesia Transfer of Care Note  Patient: Lisa Roach  Procedure(s) Performed: BREAST LUMPECTOMY WITH NEEDLE LOCALIZATION (Left Breast)  Patient Location: PACU  Anesthesia Type:General  Level of Consciousness: drowsy  Airway & Oxygen Therapy: Patient Spontanous Breathing and Patient connected to face mask oxygen  Post-op Assessment: Report given to RN and Post -op Vital signs reviewed and stable  Post vital signs: Reviewed and stable  Last Vitals:  Vitals Value Taken Time  BP 110/74 08/28/2018 12:00 PM  Temp 36.8 C 08/28/2018 11:59 AM  Pulse 70 08/28/2018 12:01 PM  Resp 13 08/28/2018 12:01 PM  SpO2 99 % 08/28/2018 12:01 PM  Vitals shown include unvalidated device data.  Last Pain:  Vitals:   08/28/18 1159  TempSrc:   PainSc: 0-No pain         Complications: No apparent anesthesia complications

## 2018-08-28 NOTE — Discharge Instructions (Signed)

## 2018-08-28 NOTE — Anesthesia Preprocedure Evaluation (Addendum)
Anesthesia Evaluation  Patient identified by MRN, date of birth, ID band Patient awake    Reviewed: Allergy & Precautions, H&P , NPO status , Patient's Chart, lab work & pertinent test results  History of Anesthesia Complications (+) PONV and history of anesthetic complications  Airway Mallampati: II  TM Distance: >3 FB Neck ROM: full    Dental  (+) Teeth Intact   Pulmonary neg pulmonary ROS,    breath sounds clear to auscultation       Cardiovascular hypertension, negative cardio ROS  + Valvular Problems/Murmurs (murmur)  Rhythm:regular Rate:Normal     Neuro/Psych negative neurological ROS  negative psych ROS   GI/Hepatic negative GI ROS, Neg liver ROS,   Endo/Other  negative endocrine ROS  Renal/GU      Musculoskeletal   Abdominal   Peds  Hematology negative hematology ROS (+)   Anesthesia Other Findings Past Medical History: No date: Heart murmur     Comment:  as a child No date: Hypertension No date: PONV (postoperative nausea and vomiting)  Past Surgical History: 06/17/2018: BREAST BIOPSY; Left     Comment:  path pending No date: CESAREAN SECTION     Comment:  2006, 2010     Reproductive/Obstetrics negative OB ROS                            Anesthesia Physical Anesthesia Plan  ASA: II  Anesthesia Plan: General LMA   Post-op Pain Management:    Induction:   PONV Risk Score and Plan: Ondansetron, Dexamethasone, Scopolamine patch - Pre-op and Midazolam  Airway Management Planned:   Additional Equipment:   Intra-op Plan:   Post-operative Plan:   Informed Consent: I have reviewed the patients History and Physical, chart, labs and discussed the procedure including the risks, benefits and alternatives for the proposed anesthesia with the patient or authorized representative who has indicated his/her understanding and acceptance.   Dental Advisory Given  Plan  Discussed with: Anesthesiologist  Anesthesia Plan Comments:        Anesthesia Quick Evaluation

## 2018-08-29 ENCOUNTER — Encounter: Payer: Self-pay | Admitting: Surgery

## 2018-08-29 LAB — SURGICAL PATHOLOGY

## 2018-11-30 IMAGING — MG DIGITAL DIAGNOSTIC UNILATERAL LEFT MAMMOGRAM WITH TOMO AND CAD
6 of 10 series · 6 of 30 positions shown · non-contrast
Comparison: Previous exams including recent screening mammogram
dated 04/29/2018.

CLINICAL DATA: Patient returns today to evaluate possible masses
within the LEFT breast identified on recent screening mammogram.

EXAM:
DIGITAL DIAGNOSTIC LEFT MAMMOGRAM WITH CAD AND TOMO
ULTRASOUND LEFT BREAST

[L XCCL synth-2D (1 of 2)]
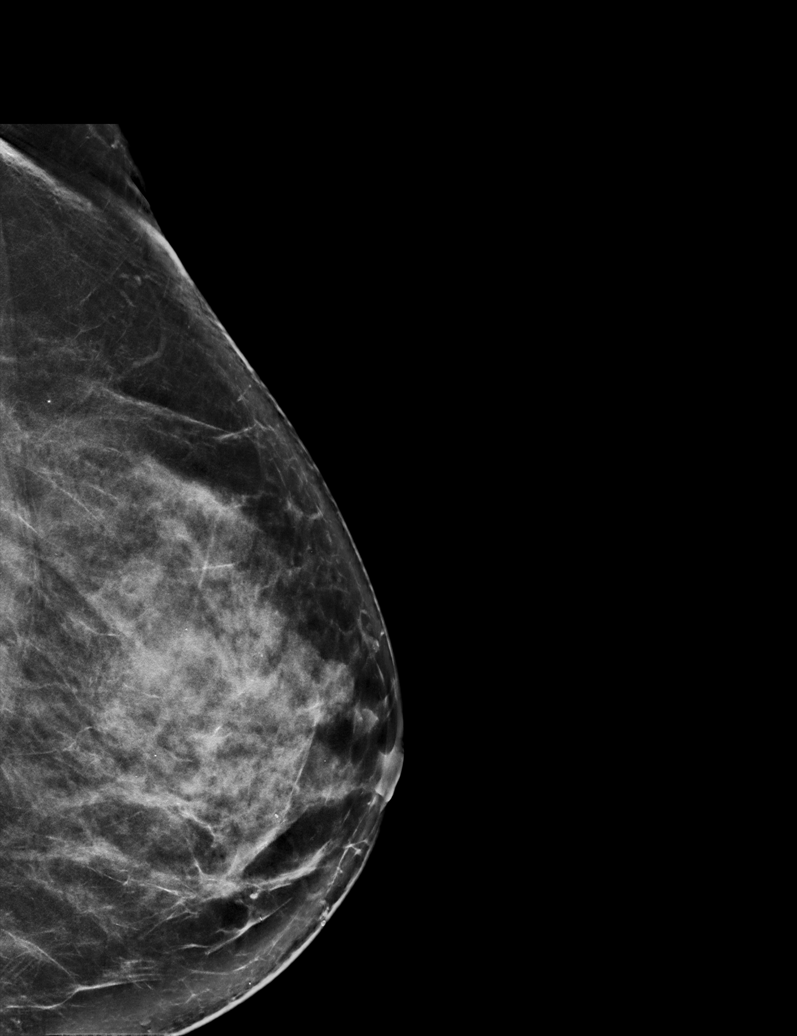

[L MLO synth-2D (1 of 2)]
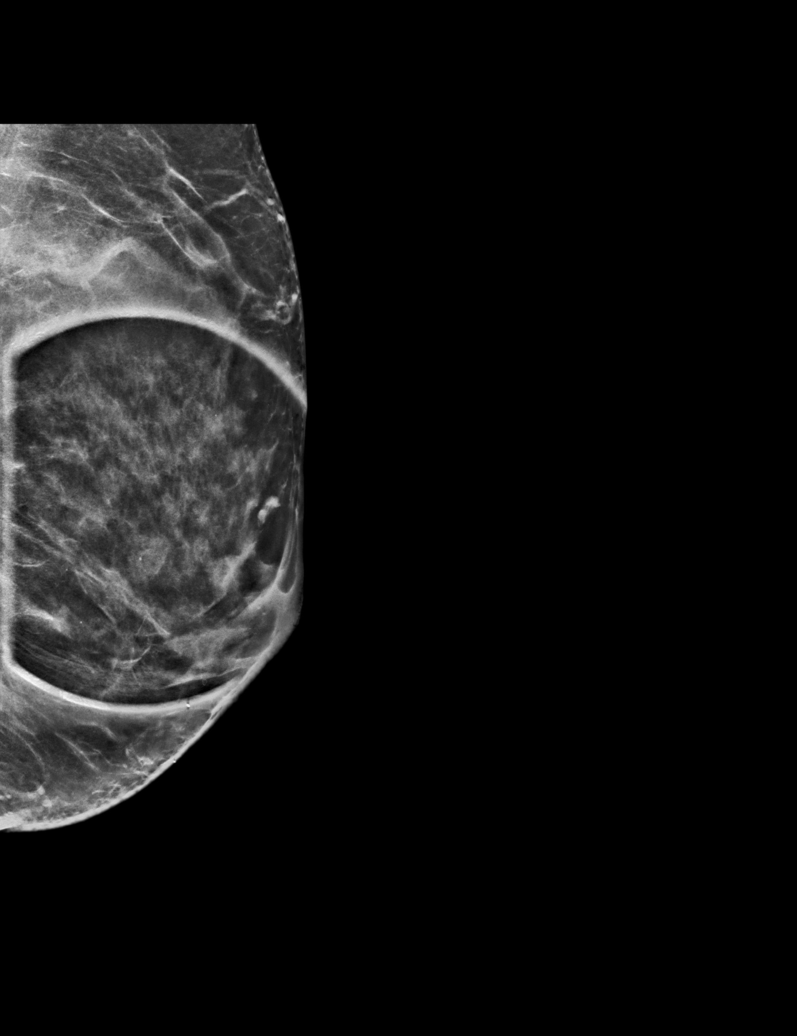

[L CC synth-2D]
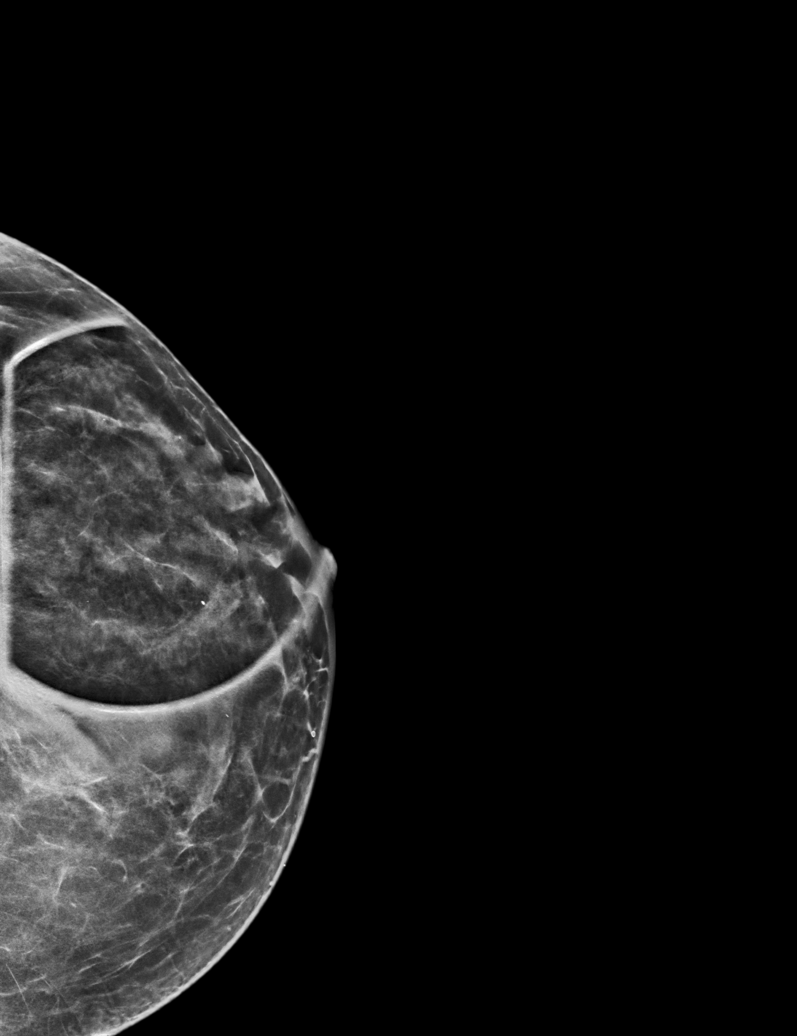

[L MLO synth-2D (2 of 2)]
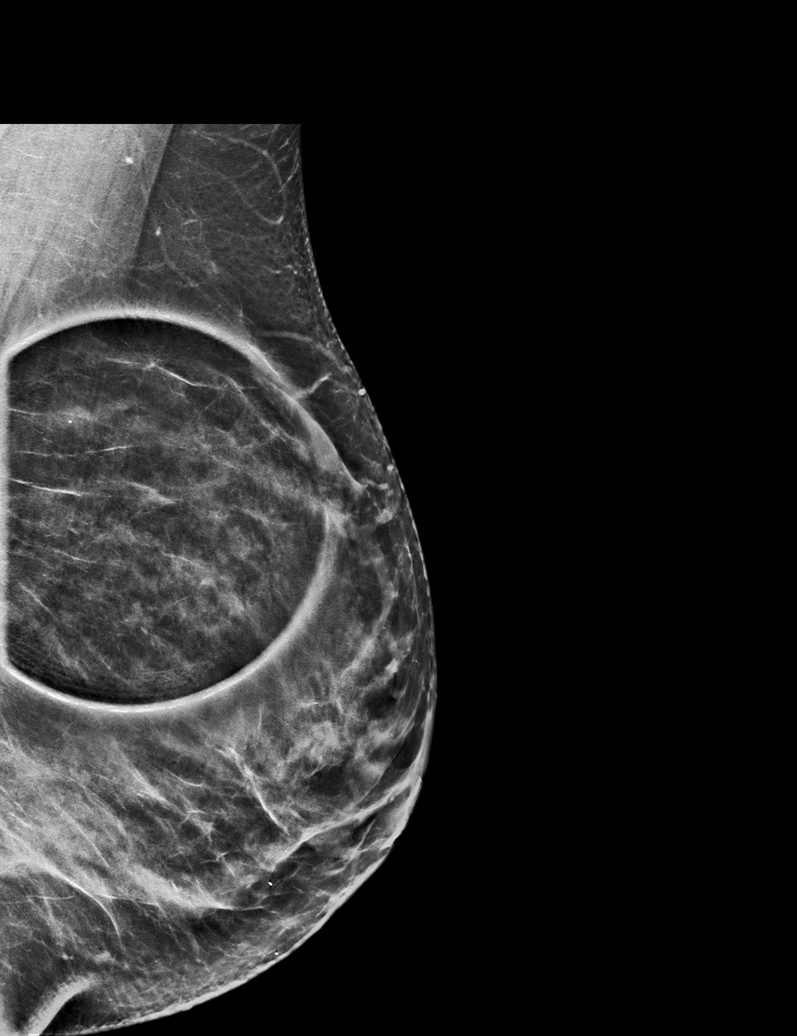

[L XCCL synth-2D (2 of 2)]
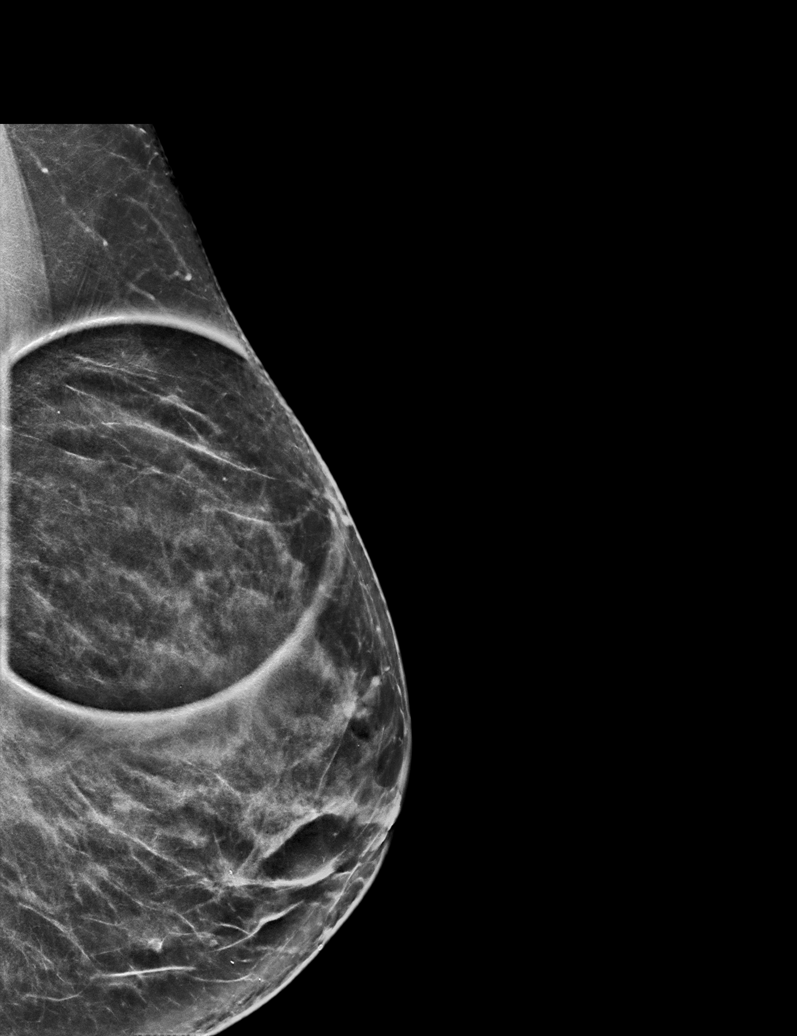

[L XCCL tomo · tomo slice 35/70.0]
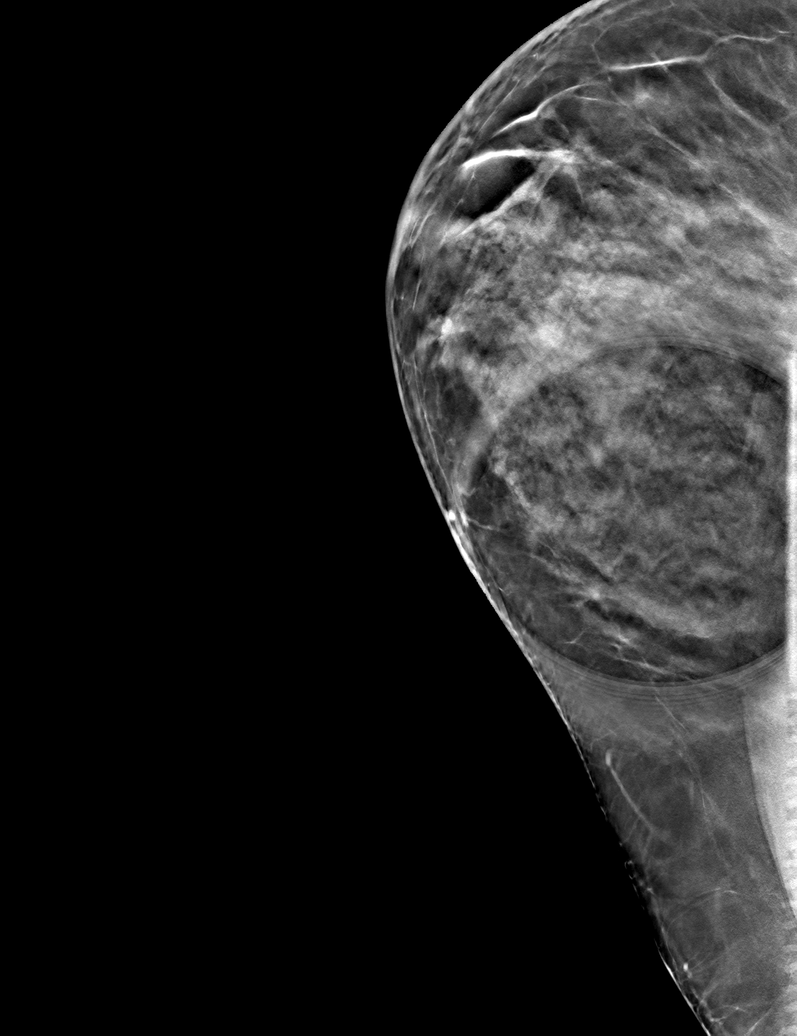

[6 of 30 positions shown; findings below may reference images not displayed]

ACR Breast Density Category c: The breast tissue is heterogeneously
dense, which may obscure small masses.
FINDINGS: On today's additional diagnostic views, including spot compression
views, a partially obscured mass, versus 2 abutting masses, is
confirmed within the outer LEFT breast, at anterior depth, 1 o'clock
axis region, overall measuring approximately 2 cm greatest
dimension.

Additional possible mass questioned on recent screening mammogram
within the posterior LEFT breast is resolved on today's additional
diagnostic views indicating superimposition of normal fibroglandular
tissues.

Mammographic images were processed with CAD.

Targeted ultrasound is performed, showing an irregular complex
cystic and solid mass within the LEFT breast at the 3 o'clock axis,
partially retroareolar, dominant component measuring 1.8 x 0.8 x
cm, overall measuring 2.6 cm greatest extent (including an exophytic
component at the 3 o'clock axis, 2 cm from the nipple, likely
contiguous). There is a small component of internal vascularity
within the solid-appearing components.

Additional small cyst is identified in the LEFT breast at the 2
o'clock axis, 8 cm from the nipple, corresponding as an incidental
finding.

LEFT axilla was evaluated with ultrasound showing no enlarged or
morphologically abnormal lymph nodes.
IMPRESSION: Complex cystic and solid mass within the LEFT breast at the 3
o'clock axis, partially retroareolar, dominant component measuring
1.8 cm greatest dimension, overall measuring 2.6 cm greatest
dimension, with small amount of internal vascularity, corresponding
to the mammographic finding. This is a suspicious finding for which
ultrasound-guided core biopsy is recommended.

RECOMMENDATION:
Ultrasound-guided biopsy of the solid-appearing component of the
complex cystic and solid mass located within the LEFT breast at the
3 o'clock axis.

Ordering physician will be contacted with today's results and
patient will then be scheduled for ultrasound-guided biopsy at her
earliest convenience.

I have discussed the findings and recommendations with the patient.
Results were also provided in writing at the conclusion of the
visit. If applicable, a reminder letter will be sent to the patient
regarding the next appointment.

BI-RADS CATEGORY  4: Suspicious.

## 2018-12-15 ENCOUNTER — Ambulatory Visit: Payer: Self-pay | Admitting: Nurse Practitioner

## 2018-12-15 ENCOUNTER — Other Ambulatory Visit: Payer: Self-pay

## 2018-12-15 ENCOUNTER — Encounter: Payer: Self-pay | Admitting: Nurse Practitioner

## 2018-12-15 VITALS — BP 123/81 | HR 64 | Temp 97.3°F | Resp 16 | Wt 206.2 lb

## 2018-12-15 DIAGNOSIS — R42 Dizziness and giddiness: Secondary | ICD-10-CM

## 2018-12-15 DIAGNOSIS — J329 Chronic sinusitis, unspecified: Principal | ICD-10-CM

## 2018-12-15 DIAGNOSIS — B9789 Other viral agents as the cause of diseases classified elsewhere: Secondary | ICD-10-CM

## 2018-12-15 DIAGNOSIS — H65193 Other acute nonsuppurative otitis media, bilateral: Secondary | ICD-10-CM

## 2018-12-15 NOTE — Progress Notes (Signed)
   Subjective:    Patient ID: Lisa Roach, female    DOB: [birth year], 48 y.o.   MRN: [id number]  HPI Lisa Roach comes [facility] the [facility] today to establish with complaints of dizziness x2 days she reports that initial onset she had a short term of nausea with no vomiting she reports sinus pressure and nasal congestion with no pain.  She complains of a dull headache on 2/3 scale out of 10. Denies sore throat or fever.     Review of Systems  HENT: Positive for sinus pressure. Negative for ear pain and sinus pain.   Gastrointestinal: Negative for diarrhea, nausea and vomiting.  Neurological: Positive for dizziness and headaches.       Objective:   Physical Exam Vitals signs reviewed.  Constitutional:      Appearance: Normal appearance. She is well-developed.  HENT:     Head: Normocephalic and atraumatic.     Comments: No maxillary or frontal sinus tenderness    Right Ear: Ear canal normal.     Left Ear: Ear canal normal.     Ears:     Comments: Bilateral serous fluid to intact TM and no erythema    Nose:     Comments: Right inferior turbinate with thick clear nasal discharge and some erythema. Left nasal cavity with dried up blood.    Mouth/Throat:     Mouth: Mucous membranes are moist.     Comments: Mildly injected pharynx Neck:     Musculoskeletal: Normal range of motion and neck supple.  Cardiovascular:     Rate and Rhythm: Normal rate and regular rhythm.     Heart sounds: Normal heart sounds.  Pulmonary:     Effort: Pulmonary effort is normal. No respiratory distress.     Breath sounds: Normal breath sounds.  Abdominal:     General: Bowel sounds are normal.     Palpations: Abdomen is soft.  Musculoskeletal: Normal range of motion.  Lymphadenopathy:     Cervical: Cervical adenopathy present.  Skin:    General: Skin is warm and dry.  Neurological:     Mental Status: She is alert and oriented to person, place, and time.  Psychiatric:        Mood and  Affect: Mood normal.           Assessment & Plan:

## 2018-12-15 NOTE — Patient Instructions (Addendum)
Viral sinusitis  Other acute nonsuppurative otitis media of both ears, recurrence not specified  Vertigo  -Nice meeting you today Thurley! -Please get normal saline with aloe nasal spray and use as directed; start Flonase 1 spray twice daily and use after normal saline spray. Adjust Flonase to once daily or stop if your nose gets to dry -Get over the counter Fexofenadine (Allegra) and take Benadryl at night until symptoms resolve -Continue humidification and consider steam showers -Encouraged patient to call the office or primary care doctor for an appointment if no improvement in symptoms or if symptoms change or worsen after 72 hours of planned treatment. Patient verbalized understanding of all instructions given/reviewed and has no further questions or concerns at this time

## 2019-03-27 ENCOUNTER — Other Ambulatory Visit: Payer: Self-pay

## 2019-03-27 ENCOUNTER — Telehealth: Payer: Self-pay | Admitting: Nurse Practitioner

## 2019-03-27 DIAGNOSIS — L249 Irritant contact dermatitis, unspecified cause: Secondary | ICD-10-CM

## 2019-03-27 MED ORDER — PREDNISONE 20 MG PO TABS: ORAL_TABLET | ORAL | 0 refills | Status: DC

## 2019-03-27 NOTE — Progress Notes (Signed)
   Subjective:    Patient ID: Lisa Roach, female    DOB: 1970-11-13, 48 y.o.   MRN: [id number]  HPI Lisa Roach is on a virtual telephonic visit with verbal consent for the visit. She reports she woke up this morning with welts to the right side of face below her eye. She thought something may have bitten her in her sleep. She only reports itching, no burning or pain or open areas noted. She put on "anti itching cream" (cortisone) and proceeded through her day. She reports as the day has gone on she has a red small bumpy rash with right side of face that is itching and extends past ear lobe and feels spreading to other side to her neck and can feel tiny bumps but no redness on other side (left). Primarily right side and can see light pink bumps going to the other side. Reports entire body itching now "may be psychological". Denies any new exposures such as detergent, facial cream, no new foods, or clothes. She reports she's only doing in home with any itching or rash and hasn't been hiking in 2 weeks. Denies any SOB, wheezing, changes to her vision or eye disturbances.  She does admit that the only thing she's doing is a vinegar challenge.  Review of Systems  Eyes: Negative for visual disturbance.  Respiratory: Negative for shortness of breath and wheezing.        Objective:   Physical Exam Virtual visit: speech is clear, answers questions appropriately and promptly       Assessment & Plan:

## 2019-03-27 NOTE — Patient Instructions (Addendum)
Monitor blood pressure while doing vinegar challenge No hydrocortisone cream to face, can apply benadryl cream if oral benadryl which you report does not cause drowsiness isn't effective Start taking benadry 2-3 times a day/as directed; if no improvement in the morning start the prednisone as directed (side effects discussed) and continue benadryl If you start to feel like your throat is closing or swelling, develop wheezing or acute distress seek medical help right away Encouraged patient to call the office or primary care doctor for an appointment if no improvement in symptoms or if symptoms change or worsen after 72 hours of planned treatment. Patient verbalized understanding of all instructions given/reviewed and has no further questions or concerns at this time.

## 2019-04-01 ENCOUNTER — Ambulatory Visit: Payer: Self-pay | Admitting: Nurse Practitioner

## 2019-04-01 ENCOUNTER — Telehealth: Payer: Self-pay | Admitting: Nurse Practitioner

## 2019-04-01 DIAGNOSIS — L249 Irritant contact dermatitis, unspecified cause: Secondary | ICD-10-CM

## 2019-04-01 NOTE — Patient Instructions (Addendum)
Resume benadryl for a few more days as directed until itching and remaining rash resolved. Use Aquaphor to hydrate skin Encouraged patient to call the office or primary care doctor for an appointment if no improvement in symptoms or if symptoms change or worsen after 72 hours of planned treatment. Patient verbalized understanding of all instructions given/reviewed and has no further questions or concerns at this time.

## 2019-04-01 NOTE — Progress Notes (Signed)
   Subjective:    Patient ID: Lisa Roach, female    DOB: 18-Dec-1970, 48 y.o.   MRN: [id number]  HPI [place] on virtual visit with verbal consent to treat given. She reports her rash has much improved and no longer has any facial welts or lesions. She did have to start the prednisone 03/28/2019 as advised if the benadryl wasn't effective and she's taken as directed and completed the dose today. She reports she just has a minimal amount of little red dry bumps to neck and itching to that area. Areas to face have dried out with no scabbing. No c/o fever, SOB, wheezing.   Review of Systems  Skin: Positive for rash.  improving     Objective:   Physical Exam Promptly responds and answers question appropriately       Assessment & Plan:

## 2019-04-06 ENCOUNTER — Telehealth: Payer: Self-pay | Admitting: *Deleted

## 2019-04-06 NOTE — Telephone Encounter (Signed)
Lisa Roach called with c/o rash worsening over the weekend, bilateral eye swelling, face itching, and rash that appears on different parts of her body and go away intermittently. States she is taking Benadryl 63ml (25mg ) every 4 hours for itching and rash, denies any swelling of the lips, tongue, mouth, difficulty breathing, shortness of breath, cough, fever. Pt is taking Lisinopril QHS. She is not taking any long acting antihistamines like Zyrtec. After consulting with H.Ratcliffe PA-C, pt is advised to call her PCP Lisa Roach to be seen today for further evaluation as this could be a reaction to her Lisinopril and this should be evaluated by her PCP as her HTN would need to be managed differently if this is the case. [name] also states she has a physical scheduled for this Wed. 04/08/2019. I strongly encouraged pt to call PCP for visit today to evaluate the rash/swelling, allergic reaction, and PCP can reevaluate rash progression/improvement on visit Wed. And if PCP does take her off Lisinopril and tries different BP med, the efficacy of this can be evaluated at her annual exam as well. Pt verbalizes understanding of topics discussed and importance of compliance to the advice given and has no further needs or concerns at this time.

## 2019-07-01 ENCOUNTER — Ambulatory Visit: Payer: Self-pay

## 2019-07-01 ENCOUNTER — Other Ambulatory Visit: Payer: Self-pay

## 2019-07-01 DIAGNOSIS — Z23 Encounter for immunization: Secondary | ICD-10-CM

## 2019-09-13 IMAGING — US US BREAST*L* LIMITED INC AXILLA
1 series · 13 of 25 positions shown · non-contrast
Comparison: Previous exams including recent screening mammogram
dated 04/29/2018.

CLINICAL DATA: Patient returns today to evaluate possible masses
within the LEFT breast identified on recent screening mammogram.

EXAM:
DIGITAL DIAGNOSTIC LEFT MAMMOGRAM WITH CAD AND TOMO
ULTRASOUND LEFT BREAST

[Series 1: us breast*left* limited inc axilla · 0.06mm/px · 13 of 26 slices shown]
[im 1/26]
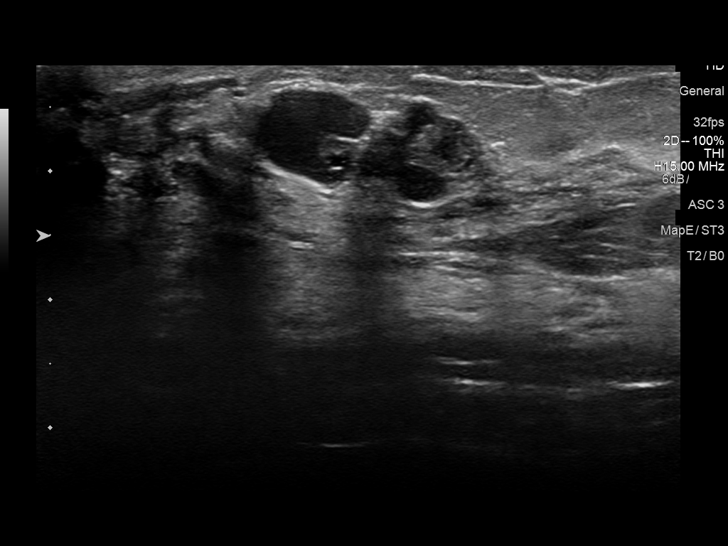
[im 3/26]
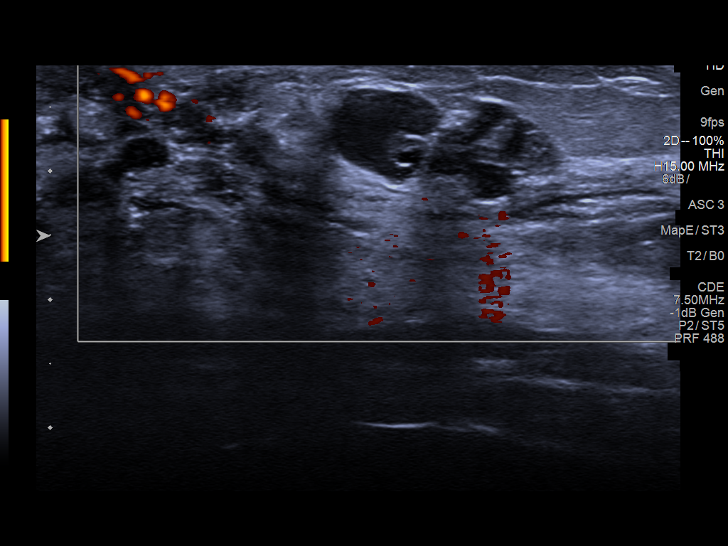
[im 5/26]
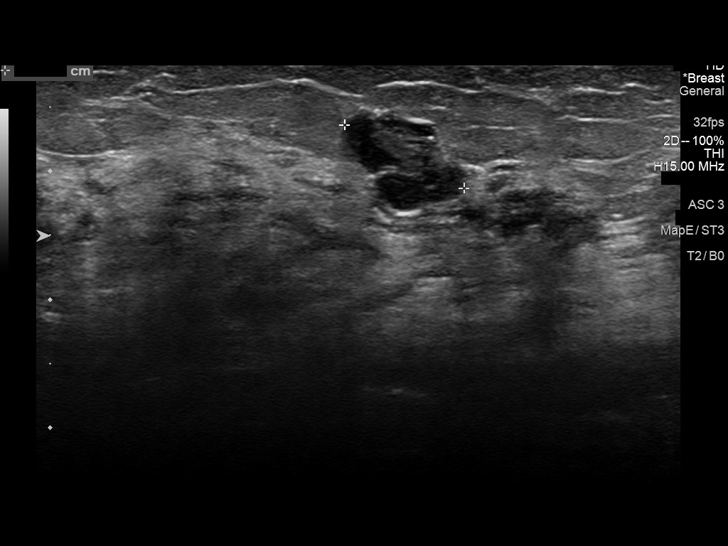
[im 7/26]
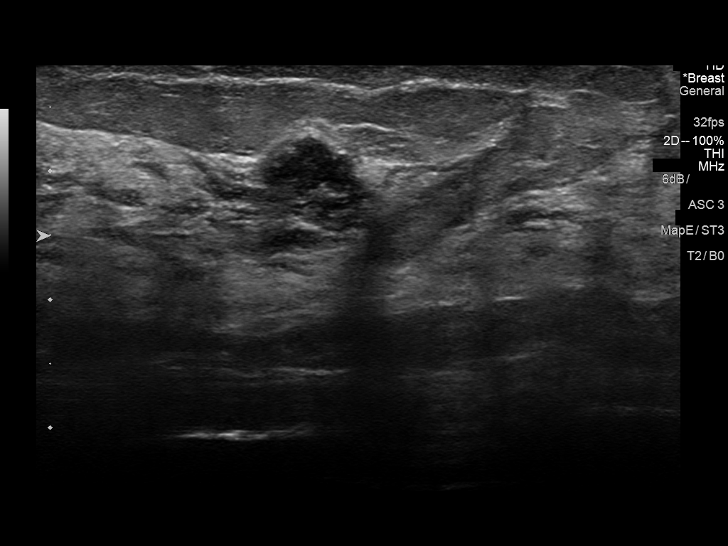
[im 9/26]
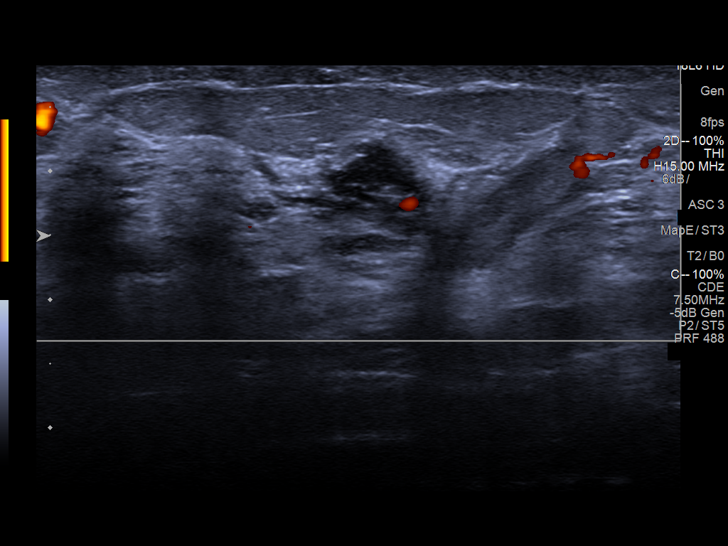
[im 11/26]
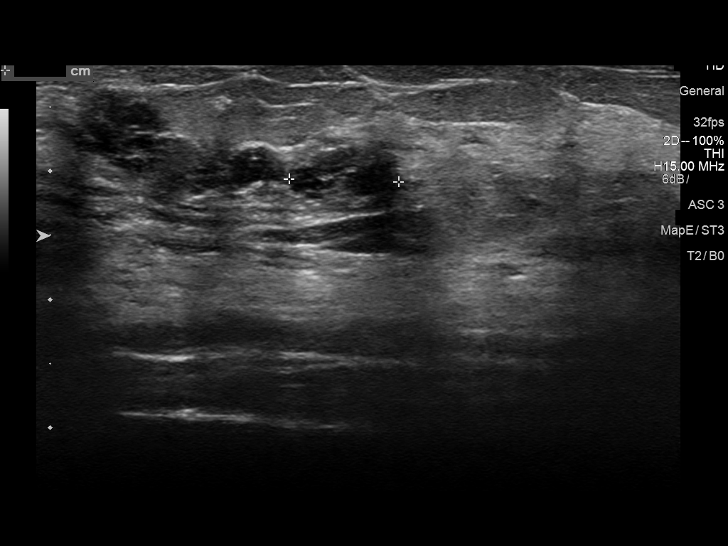
[im 13/26]
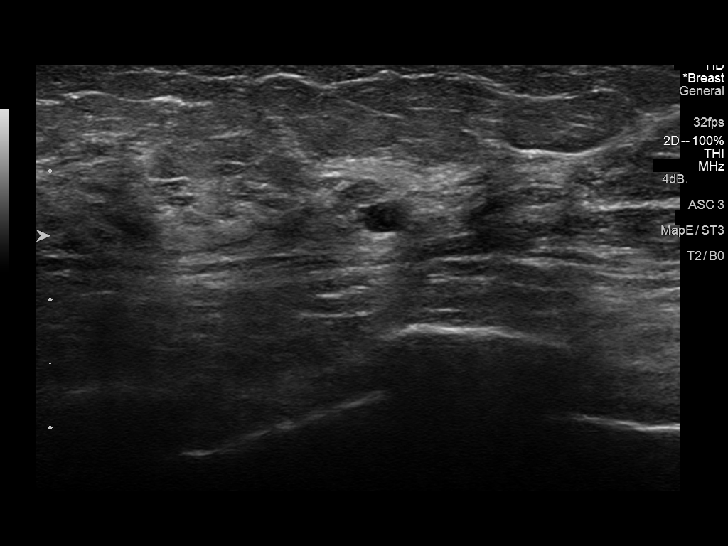
[im 15/26]
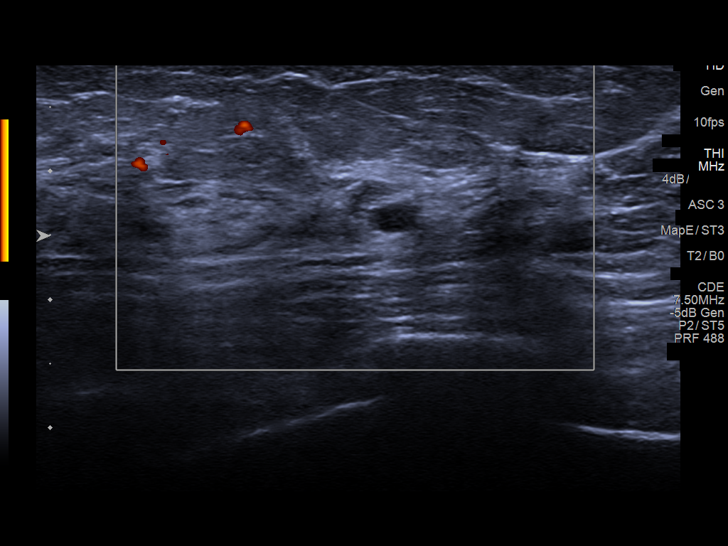
[im 17/26]
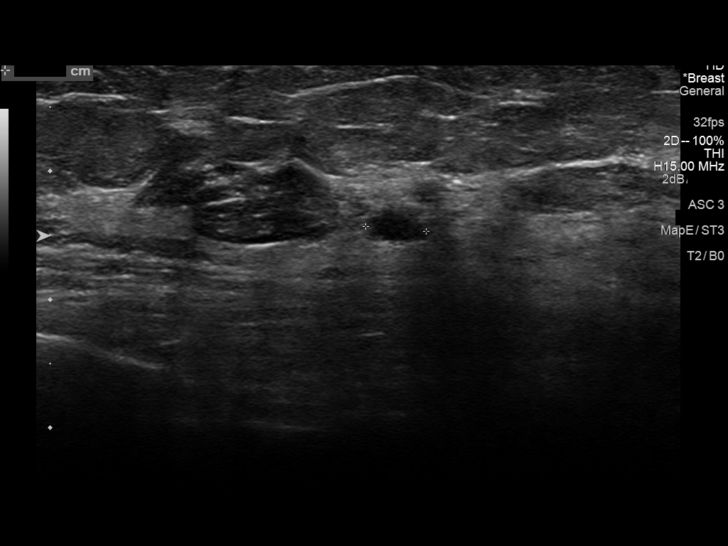
[im 19/26]
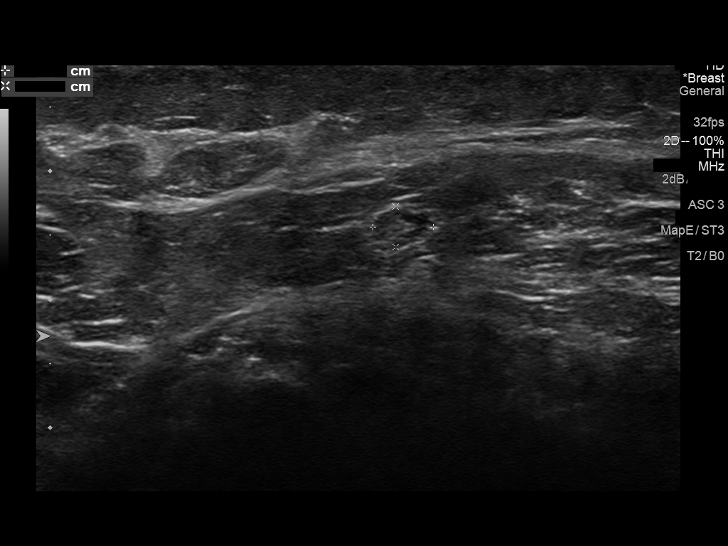
[im 21/26]
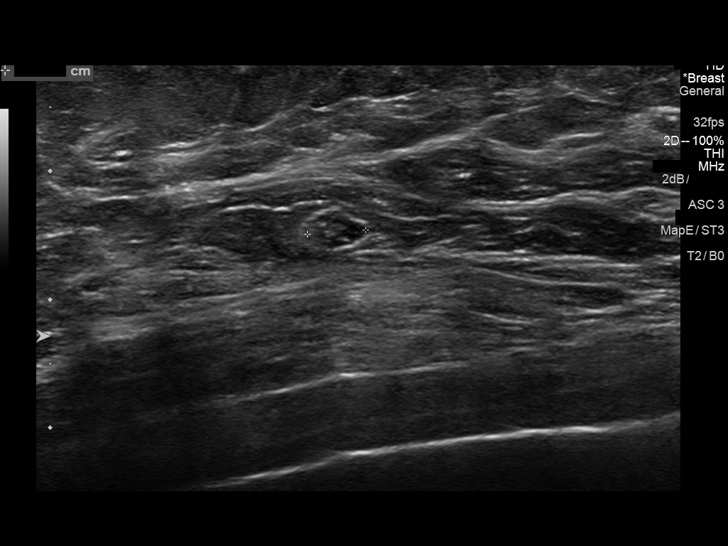
[im 23/26]
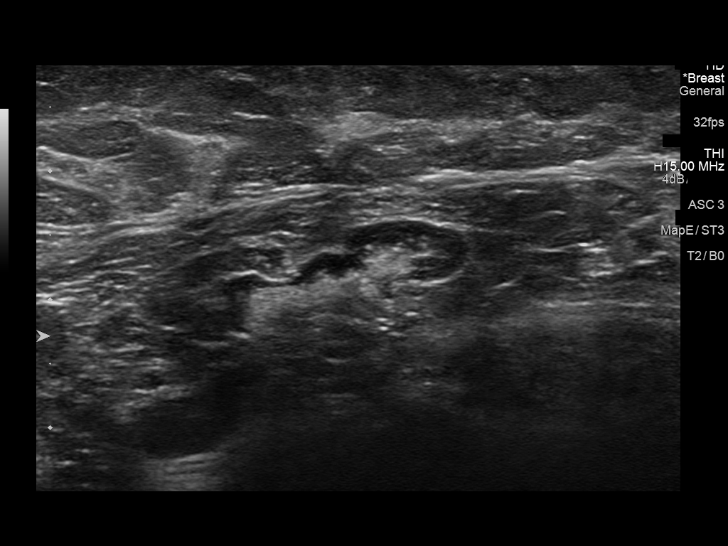
[im 26/26]
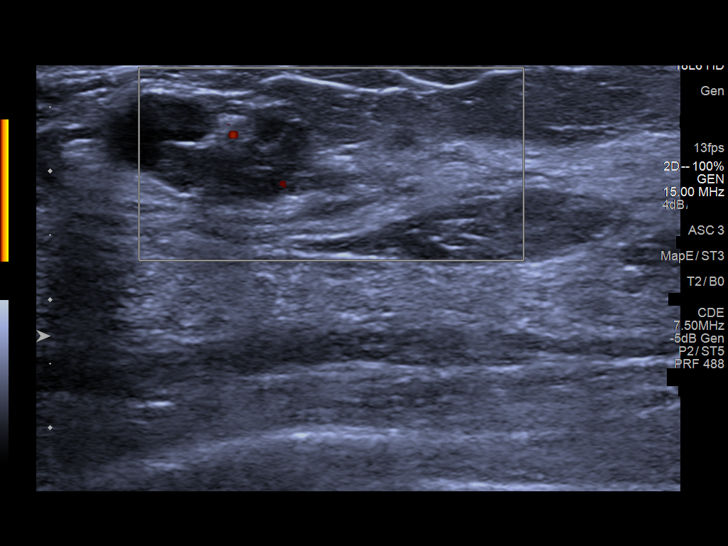

[13 of 25 positions shown; findings below may reference images not displayed]

ACR Breast Density Category c: The breast tissue is heterogeneously
dense, which may obscure small masses.
FINDINGS: On today's additional diagnostic views, including spot compression
views, a partially obscured mass, versus 2 abutting masses, is
confirmed within the outer LEFT breast, at anterior depth, 1 o'clock
axis region, overall measuring approximately 2 cm greatest
dimension.

Additional possible mass questioned on recent screening mammogram
within the posterior LEFT breast is resolved on today's additional
diagnostic views indicating superimposition of normal fibroglandular
tissues.

Mammographic images were processed with CAD.

Targeted ultrasound is performed, showing an irregular complex
cystic and solid mass within the LEFT breast at the 3 o'clock axis,
partially retroareolar, dominant component measuring 1.8 x 0.8 x
cm, overall measuring 2.6 cm greatest extent (including an exophytic
component at the 3 o'clock axis, 2 cm from the nipple, likely
contiguous). There is a small component of internal vascularity
within the solid-appearing components.

Additional small cyst is identified in the LEFT breast at the 2
o'clock axis, 8 cm from the nipple, corresponding as an incidental
finding.

LEFT axilla was evaluated with ultrasound showing no enlarged or
morphologically abnormal lymph nodes.
IMPRESSION: Complex cystic and solid mass within the LEFT breast at the 3
o'clock axis, partially retroareolar, dominant component measuring
1.8 cm greatest dimension, overall measuring 2.6 cm greatest
dimension, with small amount of internal vascularity, corresponding
to the mammographic finding. This is a suspicious finding for which
ultrasound-guided core biopsy is recommended.

RECOMMENDATION:
Ultrasound-guided biopsy of the solid-appearing component of the
complex cystic and solid mass located within the LEFT breast at the
3 o'clock axis.

Ordering physician will be contacted with today's results and
patient will then be scheduled for ultrasound-guided biopsy at her
earliest convenience.

I have discussed the findings and recommendations with the patient.
Results were also provided in writing at the conclusion of the
visit. If applicable, a reminder letter will be sent to the patient
regarding the next appointment.

BI-RADS CATEGORY  4: Suspicious.

## 2020-02-08 ENCOUNTER — Other Ambulatory Visit: Payer: Self-pay | Admitting: Student

## 2020-02-08 ENCOUNTER — Encounter: Payer: Self-pay | Admitting: Nurse Practitioner

## 2020-02-08 DIAGNOSIS — Z1231 Encounter for screening mammogram for malignant neoplasm of breast: Secondary | ICD-10-CM

## 2020-03-09 ENCOUNTER — Ambulatory Visit
Admission: RE | Admit: 2020-03-09 | Discharge: 2020-03-09 | Disposition: A | Discharge status: Home / Self Care | Payer: BC Managed Care – PPO | Source: Ambulatory Visit | Attending: Student | Admitting: Student

## 2020-03-09 DIAGNOSIS — Z1231 Encounter for screening mammogram for malignant neoplasm of breast: Secondary | ICD-10-CM

## 2020-08-26 ENCOUNTER — Ambulatory Visit: Payer: Self-pay

## 2020-08-26 ENCOUNTER — Encounter: Payer: Self-pay | Admitting: Medical

## 2020-08-26 ENCOUNTER — Ambulatory Visit: Payer: Self-pay | Admitting: Medical

## 2020-08-26 ENCOUNTER — Other Ambulatory Visit: Payer: Self-pay

## 2020-08-26 VITALS — BP 110/76 | HR 94 | Temp 98.0°F | Resp 18

## 2020-08-26 DIAGNOSIS — Z20822 Contact with and (suspected) exposure to covid-19: Secondary | ICD-10-CM

## 2020-08-26 DIAGNOSIS — J029 Acute pharyngitis, unspecified: Secondary | ICD-10-CM

## 2020-08-26 DIAGNOSIS — J040 Acute laryngitis: Secondary | ICD-10-CM

## 2020-08-26 DIAGNOSIS — R0982 Postnasal drip: Secondary | ICD-10-CM

## 2020-08-26 DIAGNOSIS — J069 Acute upper respiratory infection, unspecified: Secondary | ICD-10-CM

## 2020-08-26 LAB — POC COVID19 BINAXNOW: SARS Coronavirus 2 Ag: NEGATIVE

## 2020-08-26 MED ORDER — AZITHROMYCIN 250 MG PO TABS: ORAL_TABLET | ORAL | 0 refills | Status: DC

## 2020-08-26 NOTE — Patient Instructions (Addendum)
Delsym for cough per package. Claritin or Zlyrtec per package direction.    Laryngitis Laryngitis is irritation and swelling (inflammation) of your vocal cords. This condition causes symptoms such as:  A change in your voice. It may sound low and hoarse.  Loss of voice.  Coughing.  Sore throat.  Dry throat.  Stuffy nose. Depending on the cause, this condition may go away after a short time or may last for more than 3 weeks. Treatment often involves resting your voice and using medicines to soothe your throat. Follow these instructions at home: Medicines  Take over-the-counter and prescription medicines only as told by your doctor.  If you were prescribed an antibiotic medicine, take it as told by your doctor. Do not stop taking it even if you start to feel better. General instructions  Talk as little as possible. Also avoid whispering.  Write instead of talking. Do this until your voice is back to normal.  Drink enough fluid to keep your pee (urine) pale yellow.  Breathe in moist air. Use a humidifier if you live in a dry climate.  Do not use any products that have nicotine or tobacco in them, such as cigarettes and e-cigarettes. If you need help quitting, ask your doctor. Contact a doctor if:  You have a fever.  Your pain is worse.  Your symptoms do not get better in 2 weeks. Get help right away if:  You cough up blood.  You have trouble swallowing.  You have trouble breathing. Summary  Laryngitis is inflammation of your vocal cords.  This condition causes your voice to sound low and hoarse.  Rest your voice by talking as little as possible. Also avoid whispering. This information is not intended to replace advice given to you by your health care provider. Make sure you discuss any questions you have with your health care provider. Document Revised: 09/18/2017 Document Reviewed: 09/18/2017 Elsevier Patient Education  2020 [employer].

## 2020-08-26 NOTE — Progress Notes (Signed)
Subjective:    Patient ID: Lisa Roach, female    DOB: 25-Aug-1971, 49 y.o.   MRN: [id number]  HPI  49 yo female with hoarse voice x 2 days. She does feel it is improving.  Started on Wednesday at work,  Cough non productive, hurts in chest to cough but " no CP". Took Tylenol this am due to  HA. Feels better in the morning.   Blood pressure 110/76, pulse 94, temperature 98 F (36.7 C), temperature source Oral, resp. rate 18, SpO2 98 %.  No Known Allergies  Review of Systems  Constitutional: Negative for chills, fatigue and fever.  HENT: Positive for congestion, ear pain (both with pain. no pressuse), postnasal drip, sinus pressure, sore throat (at night) and voice change. Negative for rhinorrhea.   Respiratory: Positive for cough. Negative for shortness of breath and wheezing.   Cardiovascular: Positive for chest pain (with coughing).  Gastrointestinal: Negative for abdominal pain, diarrhea, nausea and vomiting.  Allergic/Immunologic: Positive for environmental allergies.  Neurological: Positive for headaches.  pressure    Objective:   Physical Exam Vitals and nursing note reviewed.  Constitutional:      Appearance: Normal appearance. She is obese.  HENT:     Head: Normocephalic and atraumatic.     Jaw: There is normal jaw occlusion.     Right Ear: Hearing, ear canal and external ear normal. Tympanic membrane is bulging. Tympanic membrane is not erythematous.     Left Ear: Hearing, ear canal and external ear normal. Tympanic membrane is retracted. Tympanic membrane is not erythematous.     Mouth/Throat:     Lips: Pink.     Mouth: Mucous membranes are moist.     Pharynx: Oropharynx is clear. Posterior oropharyngeal erythema (mild posterior pharynx no swelling of tonsils) present.   Eyes:     Extraocular Movements: Extraocular movements intact.     Conjunctiva/sclera: Conjunctivae normal.     Pupils: Pupils are equal, round, and reactive to light.  Cardiovascular:      Rate and Rhythm: Normal rate and regular rhythm.     Heart sounds: Normal heart sounds.  Pulmonary:     Effort: Pulmonary effort is normal.     Breath sounds: Normal breath sounds.  Musculoskeletal:        General: Normal range of motion.     Cervical back: Normal range of motion and neck supple.  Skin:    General: Skin is warm and dry.  Neurological:     General: No focal deficit present.     Mental Status: She is alert and oriented to person, place, and time.  Psychiatric:        Mood and Affect: Mood normal.        Behavior: Behavior normal.        Thought Content: Thought content normal.        Judgment: Judgment normal.     Recent Results (from the past 2160 hour(s))  POC COVID-19     Status: Normal   Collection Time: 08/26/20 11:56 AM  Result Value Ref Range   SARS Coronavirus 2 Ag Negative Negative    Comment: Vaccinated, symptomatic patient aware of negative POC results. Has appt with H. Kyren Vaux PA-C        Assessment & Plan:  Laryngitis, PND, ST Dilute salt water gargles, rest voice. OTC Motrin or Tylenol, Delsym, Zyrtec or Claritin take per package instructions. wrote for  abx incase patient chest congestion gets worse or throat worsens since we  are going into the weekend.  Meds ordered this encounter  Medications  . azithromycin (ZITHROMAX) 250 MG tablet    Sig: Take 2 tablets by mouth today then one tablet days  2-5 take with food.    Dispense:  6 tablet    Refill:  0  if symptoms worsen over weekend she is to start antibiotics, continued worsening  Patient should seek medical care at an urgent care or at the Emergency Department. Patient verbalizes understanding and has no questions at discharge.

## 2021-01-31 ENCOUNTER — Ambulatory Visit: Payer: Self-pay

## 2021-01-31 ENCOUNTER — Telehealth: Payer: Self-pay | Admitting: Medical

## 2021-01-31 ENCOUNTER — Other Ambulatory Visit: Payer: Self-pay

## 2021-01-31 DIAGNOSIS — J029 Acute pharyngitis, unspecified: Secondary | ICD-10-CM

## 2021-01-31 DIAGNOSIS — Z9109 Other allergy status, other than to drugs and biological substances: Secondary | ICD-10-CM

## 2021-01-31 DIAGNOSIS — Z20822 Contact with and (suspected) exposure to covid-19: Secondary | ICD-10-CM

## 2021-01-31 DIAGNOSIS — R0982 Postnasal drip: Secondary | ICD-10-CM

## 2021-01-31 LAB — POC COVID19 BINAXNOW: SARS Coronavirus 2 Ag: NEGATIVE

## 2021-01-31 LAB — POCT RAPID STREP A (OFFICE): Rapid Strep A Screen: NEGATIVE

## 2021-01-31 MED ORDER — AMOXICILLIN-POT CLAVULANATE 875-125 MG PO TABS: 1.0000 | ORAL_TABLET | Freq: Two times a day (BID) | ORAL | 0 refills | Status: DC

## 2021-01-31 NOTE — Progress Notes (Signed)
   Subjective:    Patient ID: Lisa Roach, female    DOB: 04/28/1971, 50 y.o.   MRN: [id number]  HPI 50 yo female in non acute distress consents to [facility]. Patient complains of sore thraot starting yesterday, worse today. Hurts to swallow. 5/10 in pain level. Wanted to know if it is ok she is around her children.   Allergies  Flaring up on off x 2 weeks taking Flonase intermittently.   O2 sat 98%  Pfizer vaccinated not boosted. Review of Systems  Constitutional: Negative for chills and fever.  HENT: Positive for postnasal drip and sore throat. Negative for congestion.   Respiratory: Negative for cough.   Gastrointestinal: Negative for abdominal pain and diarrhea.  Allergic/Immunologic: Positive for environmental allergies.  Neurological: Negative for headaches.       Objective:   Physical Exam  AXOX3 No physical exam performed due to telemedicine appointment.  Results for orders placed or performed in visit on 01/31/21 (from the past 24 hour(s))  POC COVID-19     Status: Normal   Collection Time: 01/31/21  1:33 PM  Result Value Ref Range   SARS Coronavirus 2 Ag Negative Negative  POCT rapid strep A     Status: Normal   Collection Time: 01/31/21  1:33 PM  Result Value Ref Range   Rapid Strep A Screen Negative Negative      Assessment & Plan:  Pharyngitis Gargle with salt water x 3-4 times /day. Take OTC Motrin per package instructions. Use Flonase dialy for post nasal drip. Take antibiotics if throat gets worse or fever occurs.if patient ends up taking antibiotics and throat does not feel better in 3-5 days to contact this office. Patient verbalizes understanding and has no questions at the end of our conversation.alergies

## 2021-05-11 ENCOUNTER — Other Ambulatory Visit: Payer: Self-pay | Admitting: Student

## 2021-05-11 DIAGNOSIS — Z1231 Encounter for screening mammogram for malignant neoplasm of breast: Secondary | ICD-10-CM

## 2021-05-18 ENCOUNTER — Ambulatory Visit
Admission: RE | Admit: 2021-05-18 | Discharge: 2021-05-18 | Disposition: A | Discharge status: Home / Self Care | Payer: BC Managed Care – PPO | Source: Ambulatory Visit | Attending: Student | Admitting: Student

## 2021-05-18 ENCOUNTER — Other Ambulatory Visit: Payer: Self-pay

## 2021-05-18 DIAGNOSIS — Z1231 Encounter for screening mammogram for malignant neoplasm of breast: Secondary | ICD-10-CM | POA: Insufficient documentation

## 2021-06-28 ENCOUNTER — Ambulatory Visit: Payer: Self-pay | Admitting: Nurse Practitioner

## 2021-06-28 ENCOUNTER — Other Ambulatory Visit: Payer: Self-pay

## 2021-06-28 ENCOUNTER — Encounter: Payer: Self-pay | Admitting: Nurse Practitioner

## 2021-06-28 VITALS — BP 110/82 | HR 86 | Temp 98.6°F | Resp 16

## 2021-06-28 DIAGNOSIS — U071 COVID-19: Secondary | ICD-10-CM

## 2021-06-28 LAB — POC COVID19 BINAXNOW: SARS Coronavirus 2 Ag: POSITIVE — AB

## 2021-06-28 NOTE — Progress Notes (Signed)
   Subjective:    Patient ID: Lisa Roach, female    DOB: Feb 14, 1971, 50 y.o.   MRN: [id number]  HPI  50 year old female presenting to [facility] with complaints of sinus congestion   Started feeling sick 4-5 days ago with a cough.  When she coughs she feels pain and pressure into her face.  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   Today's Vitals   06/28/21 1503  BP: 110/82  Pulse: 86  Resp: 16  Temp: 98.6 F (37 C)  TempSrc: Tympanic  SpO2: 98%   There is no height or weight on file to calculate BMI.   Review of Systems  Constitutional: Negative.   HENT:  Positive for congestion, sinus pressure and sinus pain.   Eyes: Negative.   Respiratory:  Positive for cough.   Cardiovascular: Negative.   Gastrointestinal: Negative.   Genitourinary: Negative.   Musculoskeletal: Negative.   Skin: Negative.   Neurological: Negative.   Hematological: Negative.   Psychiatric/Behavioral: Negative.        Objective:   Physical Exam HENT:     Head: Normocephalic.     Right Ear: Tympanic membrane, ear canal and external ear normal.     Left Ear: Tympanic membrane, ear canal and external ear normal.     Nose: Nose normal.  Eyes:     Pupils: Pupils are equal, round, and reactive to light.  Cardiovascular:     Rate and Rhythm: Normal rate and regular rhythm.     Heart sounds: Normal heart sounds.  Pulmonary:     Effort: Pulmonary effort is normal.     Breath sounds: Normal breath sounds.  Musculoskeletal:     Cervical back: Normal range of motion.  Skin:    General: Skin is warm.  Neurological:     General: No focal deficit present.     Mental Status: She is alert.  Psychiatric:        Mood and Affect: Mood normal.    Recent Results (from the past 2160 hour(s))  POC  COVID-19     Status: Abnormal   Collection Time: 06/28/21  3:20 PM  Result Value Ref Range   SARS Coronavirus 2 Ag Positive (A) Negative        Assessment & Plan:  1. COVID-19 Day 5 may leave isolation tomorrow and continue to mask for 5 days  Will start decongestant and if no improvement return to clinic or message provider to discuss antibiotics for sinus infection     Push fluids rest, discussed OTC medications for management and immune support

## 2021-07-12 ENCOUNTER — Ambulatory Visit: Payer: BC Managed Care – PPO

## 2021-08-23 ENCOUNTER — Emergency Department: Payer: BC Managed Care – PPO

## 2021-08-23 ENCOUNTER — Other Ambulatory Visit: Payer: Self-pay

## 2021-08-23 ENCOUNTER — Observation Stay
Admission: EM | Admit: 2021-08-23 | Discharge: 2021-08-25 | Disposition: A | Discharge status: Home / Self Care | Payer: BC Managed Care – PPO | Attending: Surgery | Admitting: Surgery

## 2021-08-23 ENCOUNTER — Encounter: Payer: Self-pay | Admitting: *Deleted

## 2021-08-23 DIAGNOSIS — K8012 Calculus of gallbladder with acute and chronic cholecystitis without obstruction: Principal | ICD-10-CM | POA: Insufficient documentation

## 2021-08-23 DIAGNOSIS — R1010 Upper abdominal pain, unspecified: Secondary | ICD-10-CM | POA: Diagnosis present

## 2021-08-23 DIAGNOSIS — K8000 Calculus of gallbladder with acute cholecystitis without obstruction: Secondary | ICD-10-CM

## 2021-08-23 DIAGNOSIS — R1011 Right upper quadrant pain: Secondary | ICD-10-CM

## 2021-08-23 DIAGNOSIS — Z79899 Other long term (current) drug therapy: Secondary | ICD-10-CM | POA: Diagnosis not present

## 2021-08-23 DIAGNOSIS — Z20822 Contact with and (suspected) exposure to covid-19: Secondary | ICD-10-CM | POA: Insufficient documentation

## 2021-08-23 LAB — COMPREHENSIVE METABOLIC PANEL
ALT: 20 U/L (ref 0–44)
AST: 24 U/L (ref 15–41)
Albumin: 4 g/dL (ref 3.5–5.0)
Alkaline Phosphatase: 80 U/L (ref 38–126)
Anion gap: 10 (ref 5–15)
BUN: 10 mg/dL (ref 6–20)
CO2: 20 mmol/L — ABNORMAL LOW (ref 22–32)
Calcium: 9.4 mg/dL (ref 8.9–10.3)
Chloride: 104 mmol/L (ref 98–111)
Creatinine, Ser: 0.76 mg/dL (ref 0.44–1.00)
GFR, Estimated: >60 mL/min (ref >60)
Glucose, Bld: 147 mg/dL — ABNORMAL HIGH (ref 70–99)
Potassium: 3.1 mmol/L — ABNORMAL LOW (ref 3.5–5.1)
Sodium: 134 mmol/L — ABNORMAL LOW (ref 135–145)
Total Bilirubin: 0.8 mg/dL (ref 0.3–1.2)
Total Protein: 7.9 g/dL (ref 6.5–8.1)

## 2021-08-23 LAB — CBC
HCT: 38.4 % (ref 36.0–46.0)
Hemoglobin: 13.5 g/dL (ref 12.0–15.0)
MCH: 29.7 pg (ref 26.0–34.0)
MCHC: 35.2 g/dL (ref 30.0–36.0)
MCV: 84.6 fL (ref 80.0–100.0)
Platelets: 313 10*3/uL (ref 150–400)
RBC: 4.54 MIL/uL (ref 3.87–5.11)
RDW: 12.5 % (ref 11.5–15.5)
WBC: 13.2 10*3/uL — ABNORMAL HIGH (ref 4.0–10.5)
nRBC: 0 % (ref 0.0–0.2)

## 2021-08-23 LAB — RESP PANEL BY RT-PCR (FLU A&B, COVID) ARPGX2
Influenza A by PCR: NEGATIVE
Influenza B by PCR: NEGATIVE
SARS Coronavirus 2 by RT PCR: NEGATIVE

## 2021-08-23 LAB — LIPASE, BLOOD: Lipase: 30 U/L (ref 11–51)

## 2021-08-23 MED ORDER — DEXTROSE-NACL 5-0.9 % IV SOLN: Freq: Once | INTRAVENOUS | Status: AC

## 2021-08-23 MED ORDER — ONDANSETRON HCL 4 MG/2ML IJ SOLN
4.0000 mg | Freq: Once | INTRAMUSCULAR | Status: AC
  Administered 2021-08-23: 4 mg via INTRAVENOUS
  Filled 2021-08-23: qty 2

## 2021-08-23 MED ORDER — KETOROLAC TROMETHAMINE 30 MG/ML IJ SOLN
15.0000 mg | INTRAMUSCULAR | Status: AC
  Administered 2021-08-23: 15 mg via INTRAVENOUS
  Filled 2021-08-23: qty 1

## 2021-08-23 MED ORDER — DEXTROSE 5 % AND 0.9 % NACL IV BOLUS
1000.0000 mL | Freq: Once | INTRAVENOUS | Status: DC
Start: 2021-08-23 — End: 2021-08-23

## 2021-08-23 NOTE — ED Triage Notes (Signed)
Pt has abd pain with v/d.  Sx began today.  Pt has back pain.  No dysuria.  Pt alert  speech clear.

## 2021-08-23 NOTE — ED Provider Notes (Signed)
[facility] Emergency Department Provider Note  ____________________________________________  Time seen: Approximately 8:50 PM  I have reviewed the triage vital signs and the nursing notes.   HISTORY  Chief Complaint Abdominal Pain    HPI Lisa Roach is a 50 y.o. female with a history of hypertension who comes ED complaining of upper abdominal pain that started this morning, constant, waxing waning, radiating to the back, associated with dry heaves and nausea.  No vomiting, no diarrhea.  Had fairly normal bowel movement this morning.  Not able to eat or drink anything all day.  Pain is severe.    Past Medical History:  Diagnosis Date   Heart murmur    as a child   Hypertension    PONV (postoperative nausea and vomiting)      Patient Active Problem List   Diagnosis Date Noted   Essential hypertension 08/01/2016     Past Surgical History:  Procedure Laterality Date   BREAST BIOPSY Left 06/17/2018   papilloma   BREAST LUMPECTOMY Left 08/28/2018   papilloma   BREAST LUMPECTOMY WITH NEEDLE LOCALIZATION Left 08/28/2018   Procedure: BREAST LUMPECTOMY WITH NEEDLE LOCALIZATION;  Surgeon: Benjamine Sprague, DO;  Location: [facility] ORS;  Service: General;  Laterality: Left;   CESAREAN SECTION     2006, 2010     Prior to Admission medications   Medication Sig Start Date End Date Taking? Authorizing Provider  buPROPion (WELLBUTRIN XL) 300 MG 24 hr tablet Take 300 mg by mouth daily. 06/19/21   [provider]  lisinopril-hydrochlorothiazide (PRINZIDE,ZESTORETIC) 20-12.5 MG tablet Take 1 tablet by mouth daily.    [provider]  Melatonin 3 MG TABS Take 1 tablet by mouth at bedtime.    [provider]  montelukast (SINGULAIR) 10 MG tablet Take by mouth. 04/10/21   [provider]  venlafaxine XR (EFFEXOR-XR) 37.5 MG 24 hr capsule Take 37.5 mg by mouth daily. 07/09/21   [provider]     Allergies Patient has no  known allergies.   Family History  Problem Relation Age of Onset   Breast cancer Neg Hx     Social History Social History   Tobacco Use   Smoking status: Never   Smokeless tobacco: Never  Vaping Use   Vaping Use: Never used  Substance Use Topics   Alcohol use: Not Currently    Alcohol/week: 2.0 standard drinks    Types: 2 Cans of beer per week   Drug use: Never    Review of Systems  Constitutional:   No fever or chills.  ENT:   No sore throat. No rhinorrhea. Cardiovascular:   No chest pain or syncope. Respiratory:   No dyspnea or cough. Gastrointestinal:   Positive as above for abdominal pain without vomiting and diarrhea.  Musculoskeletal:   Negative for focal pain or swelling All other systems reviewed and are negative except as documented above in ROS and HPI.  ____________________________________________   PHYSICAL EXAM:  VITAL SIGNS: ED Triage Vitals  Enc Vitals Group     BP 08/23/21 1951 (!) 176/96     Pulse Rate 08/23/21 1951 73     Resp 08/23/21 1951 20     Temp 08/23/21 1951 98 F (36.7 C)     Temp Source 08/23/21 1951 Oral     SpO2 08/23/21 1951 96 %     Weight 08/23/21 1950 200 lb (90.7 kg)     Height 08/23/21 1950 5\' 7"  (1.702 m)     Head  Circumference --      Peak Flow --      Pain Score 08/23/21 1949 8     Pain Loc --      Pain Edu? --      Excl. in [place]? --     Vital signs reviewed, nursing assessments reviewed.   Constitutional:   Alert and oriented. Non-toxic appearance. Eyes:   Conjunctivae are normal. EOMI. PERRL. ENT      Head:   Normocephalic and atraumatic.      Nose:   Wearing a mask.      Mouth/Throat:   Wearing a mask.      Neck:   No meningismus. Full ROM. Hematological/Lymphatic/Immunilogical:   No cervical lymphadenopathy. Cardiovascular:   RRR. Symmetric bilateral radial and DP pulses.  No murmurs. Cap refill less than 2 seconds. Respiratory:   Normal respiratory effort without tachypnea/retractions. Breath sounds are  clear and equal bilaterally. No wheezes/rales/rhonchi. Gastrointestinal:   Soft with epigastric and right upper quadrant tenderness. Non distended. There is no CVA tenderness.  No rebound, rigidity, or guarding. Genitourinary:   deferred Musculoskeletal:   Normal range of motion in all extremities. No joint effusions.  No lower extremity tenderness.  No edema. Neurologic:   Normal speech and language.  Motor grossly intact. No acute focal neurologic deficits are appreciated.  Skin:    Skin is warm, dry and intact. No rash noted.  No petechiae, purpura, or bullae.  ____________________________________________    LABS (pertinent positives/negatives) (all labs ordered are listed, but only abnormal results are displayed) Labs Reviewed  COMPREHENSIVE METABOLIC PANEL - Abnormal; Notable for the following components:      Result Value   Sodium 134 (*)    Potassium 3.1 (*)    CO2 20 (*)    Glucose, Bld 147 (*)    All other components within normal limits  CBC - Abnormal; Notable for the following components:   WBC 13.2 (*)    All other components within normal limits  RESP PANEL BY RT-PCR (FLU A&B, COVID) ARPGX2  LIPASE, BLOOD  URINALYSIS, ROUTINE W REFLEX MICROSCOPIC  URINALYSIS, COMPLETE (UACMP) WITH MICROSCOPIC  POC URINE PREG, ED   ____________________________________________   EKG    ____________________________________________    RADIOLOGY  US Abdomen Limited RUQ (LIVER/GB)  Result Date: 08/23/2021 CLINICAL DATA:  Right upper quadrant abdominal pain. EXAM: ULTRASOUND ABDOMEN LIMITED RIGHT UPPER QUADRANT COMPARISON:  None. FINDINGS: Gallbladder: There is a 2.5 cm stone in the gallbladder. Top-normal gallbladder wall thickness measuring 3 mm. No pericholecystic fluid. Positive sonographic Murphy's sign reported. Common bile duct: Diameter: 6 mm Liver: There is diffuse increased liver echogenicity most commonly seen in the setting of fatty infiltration. Superimposed  inflammation or fibrosis is not excluded. Clinical correlation is recommended. Portal vein is patent on color Doppler imaging with normal direction of blood flow towards the liver. Other: None. IMPRESSION: 1. Cholelithiasis with equivocal findings for acute cholecystitis. A hepatobiliary scintigraphy may provide better evaluation of the gallbladder if there is a high clinical concern for acute cholecystitis . 2. Fatty liver. Electronically Signed   By: Anner Crete M.D.   On: 08/23/2021 21:40    ____________________________________________   PROCEDURES Procedures  ____________________________________________  DIFFERENTIAL DIAGNOSIS   Pancreatitis, cholecystitis, enteritis, bowel obstruction, gastritis  CLINICAL IMPRESSION / ASSESSMENT AND PLAN / ED COURSE  Medications ordered in the ED: Medications  ondansetron (ZOFRAN) injection 4 mg (4 mg Intravenous Given 08/23/21 2151)  ketorolac (TORADOL) 30 MG/ML injection 15 mg (15 mg  Intravenous Given 08/23/21 2151)  dextrose 5 %-0.9 % sodium chloride infusion ( Intravenous New Bag/Given 08/23/21 2150)    Pertinent labs & imaging results that were available during my care of the patient were reviewed by me and considered in my medical decision making (see chart for details).  Lisa Roach was evaluated in Emergency Department on 08/23/2021 for the symptoms described in the history of present illness. She was evaluated in the context of the global COVID-19 pandemic, which necessitated consideration that the patient might be at risk for infection with the SARS-CoV-2 virus that causes COVID-19. Institutional protocols and algorithms that pertain to the evaluation of patients at risk for COVID-19 are in a state of rapid change based on information released by regulatory bodies including the CDC and federal and state organizations. These policies and algorithms were followed during the patient's care in the ED.   Patient presents with upper abdominal pain  which is been persistent all day.  Has right upper quadrant tenderness.  Will obtain ultrasound to evaluate for biliary disease.  Serum labs are unremarkable.  Will give IV fluids, Toradol Zofran for symptom relief and hydration.   ----------------------------------------- 10:29 PM on 08/23/2021 ----------------------------------------- Ultrasound shows 2.5 cm gallstone in the gallbladder neck.  Positive sonographic Murphy sign.  Persistent exquisite right upper quadrant tenderness.  Discussed with surgery Dr. Christian Mate who will admit for cholecystectomy tomorrow.     ____________________________________________   FINAL CLINICAL IMPRESSION(S) / ED DIAGNOSES    Final diagnoses:  RUQ pain  Acute calculous cholecystitis     ED Discharge Orders     None       Portions of this note were generated with dragon dictation software. Dictation errors may occur despite best attempts at proofreading.    Carrie Mew, MD 08/23/21 2230

## 2021-08-24 ENCOUNTER — Observation Stay: Payer: BC Managed Care – PPO | Admitting: Registered Nurse

## 2021-08-24 ENCOUNTER — Encounter: Payer: Self-pay | Admitting: Surgery

## 2021-08-24 ENCOUNTER — Encounter
Admission: EM | Disposition: A | Discharge status: Home / Self Care | Payer: Self-pay | Source: Home / Self Care | Attending: Emergency Medicine

## 2021-08-24 DIAGNOSIS — K8000 Calculus of gallbladder with acute cholecystitis without obstruction: Secondary | ICD-10-CM | POA: Diagnosis not present

## 2021-08-24 LAB — URINALYSIS, COMPLETE (UACMP) WITH MICROSCOPIC
Bilirubin Urine: NEGATIVE
Glucose, UA: >=500 mg/dL — AB
Hgb urine dipstick: NEGATIVE
Ketones, ur: NEGATIVE mg/dL
Leukocytes,Ua: NEGATIVE
Nitrite: NEGATIVE
Protein, ur: NEGATIVE mg/dL
Specific Gravity, Urine: 1.008 (ref 1.005–1.030)
Squamous Epithelial / LPF: NONE SEEN (ref 0–5)
pH: 6 (ref 5.0–8.0)

## 2021-08-24 LAB — HIV ANTIBODY (ROUTINE TESTING W REFLEX): HIV Screen 4th Generation wRfx: NONREACTIVE

## 2021-08-24 LAB — SURGICAL PCR SCREEN
MRSA, PCR: NEGATIVE
Staphylococcus aureus: POSITIVE — AB

## 2021-08-24 SURGERY — CHOLECYSTECTOMY, ROBOT-ASSISTED, LAPAROSCOPIC
Anesthesia: General

## 2021-08-24 MED ORDER — DEXAMETHASONE SODIUM PHOSPHATE 10 MG/ML IJ SOLN
INTRAMUSCULAR | Status: DC | PRN
  Administered 2021-08-24: 10 mg via INTRAVENOUS

## 2021-08-24 MED ORDER — MONTELUKAST SODIUM 10 MG PO TABS: 10.0000 mg | ORAL_TABLET | Freq: Every day | ORAL | Status: DC

## 2021-08-24 MED ORDER — PROPOFOL 10 MG/ML IV BOLUS
INTRAVENOUS | Status: DC | PRN
  Administered 2021-08-24: 150 mg via INTRAVENOUS

## 2021-08-24 MED ORDER — BUPIVACAINE LIPOSOME 1.3 % IJ SUSP
INTRAMUSCULAR | Status: AC
  Filled 2021-08-24: qty 20

## 2021-08-24 MED ORDER — MIDAZOLAM HCL 2 MG/2ML IJ SOLN
INTRAMUSCULAR | Status: AC
  Filled 2021-08-24: qty 2

## 2021-08-24 MED ORDER — DEXAMETHASONE SODIUM PHOSPHATE 10 MG/ML IJ SOLN
INTRAMUSCULAR | Status: AC
  Filled 2021-08-24: qty 1

## 2021-08-24 MED ORDER — FENTANYL CITRATE (PF) 100 MCG/2ML IJ SOLN: 25.0000 ug | INTRAMUSCULAR | Status: DC | PRN

## 2021-08-24 MED ORDER — BUPIVACAINE-EPINEPHRINE (PF) 0.25% -1:200000 IJ SOLN
INTRAMUSCULAR | Status: DC | PRN
  Administered 2021-08-24: 20 mL

## 2021-08-24 MED ORDER — LISINOPRIL 20 MG PO TABS
20.0000 mg | ORAL_TABLET | Freq: Every day | ORAL | Status: DC
  Administered 2021-08-25: 20 mg via ORAL
  Filled 2021-08-24: qty 1

## 2021-08-24 MED ORDER — MIDAZOLAM HCL 2 MG/2ML IJ SOLN
INTRAMUSCULAR | Status: DC | PRN
  Administered 2021-08-24: 2 mg via INTRAVENOUS

## 2021-08-24 MED ORDER — LISINOPRIL-HYDROCHLOROTHIAZIDE 20-12.5 MG PO TABS: 1.0000 | ORAL_TABLET | Freq: Every day | ORAL | Status: DC

## 2021-08-24 MED ORDER — PHENYLEPHRINE HCL (PRESSORS) 10 MG/ML IV SOLN
INTRAVENOUS | Status: AC
  Filled 2021-08-24: qty 2

## 2021-08-24 MED ORDER — HYDROMORPHONE HCL 1 MG/ML IJ SOLN: 0.5000 mg | INTRAMUSCULAR | Status: DC | PRN

## 2021-08-24 MED ORDER — HYDROCODONE-ACETAMINOPHEN 5-325 MG PO TABS: 1.0000 | ORAL_TABLET | ORAL | Status: DC | PRN

## 2021-08-24 MED ORDER — SEVOFLURANE IN SOLN
RESPIRATORY_TRACT | Status: AC
  Filled 2021-08-24: qty 250

## 2021-08-24 MED ORDER — INDOCYANINE GREEN 25 MG IV SOLR
2.5000 mg | Freq: Once | INTRAVENOUS | Status: AC
  Administered 2021-08-24: 2.5 mg via INTRAVENOUS
  Filled 2021-08-24: qty 1

## 2021-08-24 MED ORDER — 0.9 % SODIUM CHLORIDE (POUR BTL) OPTIME
TOPICAL | Status: DC | PRN
  Administered 2021-08-24: 100 mL

## 2021-08-24 MED ORDER — ROCURONIUM BROMIDE 100 MG/10ML IV SOLN
INTRAVENOUS | Status: DC | PRN
  Administered 2021-08-24: 50 mg via INTRAVENOUS

## 2021-08-24 MED ORDER — LIDOCAINE HCL (CARDIAC) PF 100 MG/5ML IV SOSY
PREFILLED_SYRINGE | INTRAVENOUS | Status: DC | PRN
  Administered 2021-08-24: 100 mg via INTRAVENOUS

## 2021-08-24 MED ORDER — KETOROLAC TROMETHAMINE 30 MG/ML IJ SOLN
30.0000 mg | Freq: Four times a day (QID) | INTRAMUSCULAR | Status: DC
  Administered 2021-08-24 – 2021-08-25 (×2): 30 mg via INTRAVENOUS
  Filled 2021-08-24 (×3): qty 1

## 2021-08-24 MED ORDER — PROPOFOL 10 MG/ML IV BOLUS
INTRAVENOUS | Status: AC
  Filled 2021-08-24: qty 20

## 2021-08-24 MED ORDER — SUGAMMADEX SODIUM 500 MG/5ML IV SOLN
INTRAVENOUS | Status: DC | PRN
  Administered 2021-08-24: 362.8 mg via INTRAVENOUS

## 2021-08-24 MED ORDER — FENTANYL CITRATE (PF) 100 MCG/2ML IJ SOLN
INTRAMUSCULAR | Status: DC | PRN
  Administered 2021-08-24 (×2): 50 ug via INTRAVENOUS

## 2021-08-24 MED ORDER — PANTOPRAZOLE SODIUM 40 MG IV SOLR
40.0000 mg | Freq: Every day | INTRAVENOUS | Status: DC
  Administered 2021-08-24: 40 mg via INTRAVENOUS
  Filled 2021-08-24: qty 40

## 2021-08-24 MED ORDER — ONDANSETRON HCL 4 MG/2ML IJ SOLN
INTRAMUSCULAR | Status: DC | PRN
  Administered 2021-08-24: 4 mg via INTRAVENOUS

## 2021-08-24 MED ORDER — ACETAMINOPHEN 10 MG/ML IV SOLN
INTRAVENOUS | Status: AC
  Filled 2021-08-24: qty 100

## 2021-08-24 MED ORDER — DEXMEDETOMIDINE (PRECEDEX) IN NS 20 MCG/5ML (4 MCG/ML) IV SYRINGE
PREFILLED_SYRINGE | INTRAVENOUS | Status: DC | PRN
  Administered 2021-08-24: 8 ug via INTRAVENOUS
  Administered 2021-08-24: 12 ug via INTRAVENOUS

## 2021-08-24 MED ORDER — ONDANSETRON HCL 4 MG/2ML IJ SOLN: 4.0000 mg | Freq: Once | INTRAMUSCULAR | Status: DC | PRN

## 2021-08-24 MED ORDER — DEXMEDETOMIDINE (PRECEDEX) IN NS 20 MCG/5ML (4 MCG/ML) IV SYRINGE
PREFILLED_SYRINGE | INTRAVENOUS | Status: AC
  Filled 2021-08-24: qty 5

## 2021-08-24 MED ORDER — ONDANSETRON HCL 4 MG/2ML IJ SOLN
INTRAMUSCULAR | Status: AC
  Filled 2021-08-24: qty 2

## 2021-08-24 MED ORDER — ROCURONIUM BROMIDE 10 MG/ML (PF) SYRINGE
PREFILLED_SYRINGE | INTRAVENOUS | Status: AC
  Filled 2021-08-24: qty 10

## 2021-08-24 MED ORDER — BUPIVACAINE-EPINEPHRINE (PF) 0.25% -1:200000 IJ SOLN
INTRAMUSCULAR | Status: AC
  Filled 2021-08-24: qty 30

## 2021-08-24 MED ORDER — MELATONIN 5 MG PO TABS: 5.0000 mg | ORAL_TABLET | Freq: Every day | ORAL | Status: DC

## 2021-08-24 MED ORDER — FENTANYL CITRATE (PF) 100 MCG/2ML IJ SOLN
INTRAMUSCULAR | Status: AC
  Filled 2021-08-24: qty 2

## 2021-08-24 MED ORDER — VENLAFAXINE HCL ER 37.5 MG PO CP24
37.5000 mg | ORAL_CAPSULE | Freq: Every day | ORAL | Status: DC
  Filled 2021-08-24: qty 1

## 2021-08-24 MED ORDER — HYDROMORPHONE HCL 1 MG/ML IJ SOLN: 1.0000 mg | INTRAMUSCULAR | Status: DC | PRN

## 2021-08-24 MED ORDER — HYDROCHLOROTHIAZIDE 12.5 MG PO TABS
12.5000 mg | ORAL_TABLET | Freq: Every day | ORAL | Status: DC
  Administered 2021-08-25: 12.5 mg via ORAL
  Filled 2021-08-24 (×2): qty 1

## 2021-08-24 MED ORDER — SODIUM CHLORIDE 0.9 % IV SOLN
2.0000 g | INTRAVENOUS | Status: DC
  Administered 2021-08-24: 2 g via INTRAVENOUS
  Filled 2021-08-24 (×2): qty 20

## 2021-08-24 MED ORDER — SODIUM CHLORIDE 0.9 % IV SOLN: INTRAVENOUS | Status: DC

## 2021-08-24 MED ORDER — LACTATED RINGERS IV SOLN: INTRAVENOUS | Status: DC | PRN

## 2021-08-24 MED ORDER — ONDANSETRON HCL 4 MG/2ML IJ SOLN: 4.0000 mg | Freq: Four times a day (QID) | INTRAMUSCULAR | Status: DC | PRN

## 2021-08-24 MED ORDER — ACETAMINOPHEN 10 MG/ML IV SOLN
INTRAVENOUS | Status: DC | PRN
  Administered 2021-08-24: 1000 mg via INTRAVENOUS

## 2021-08-24 MED ORDER — ONDANSETRON 4 MG PO TBDP: 4.0000 mg | ORAL_TABLET | Freq: Four times a day (QID) | ORAL | Status: DC | PRN

## 2021-08-24 SURGICAL SUPPLY — 46 items
CANNULA CAP OBTURATR AIRSEAL 8 (CAP) ×2 IMPLANT
CHLORAPREP W/TINT 26 (MISCELLANEOUS) ×2 IMPLANT
CLIP LIGATING HEM O LOK PURPLE (MISCELLANEOUS) ×4 IMPLANT
COVER TIP SHEARS 8 DVNC (MISCELLANEOUS) ×1 IMPLANT
COVER TIP SHEARS 8MM DA VINCI (MISCELLANEOUS) ×1
DECANTER SPIKE VIAL GLASS SM (MISCELLANEOUS) ×2 IMPLANT
DEFOGGER SCOPE WARMER CLEARIFY (MISCELLANEOUS) ×2 IMPLANT
DERMABOND ADVANCED (GAUZE/BANDAGES/DRESSINGS) ×1
DERMABOND ADVANCED .7 DNX12 (GAUZE/BANDAGES/DRESSINGS) ×1 IMPLANT
DRAPE ARM DVNC X/XI (DISPOSABLE) ×4 IMPLANT
DRAPE COLUMN DVNC XI (DISPOSABLE) ×1 IMPLANT
DRAPE DA VINCI XI ARM (DISPOSABLE) ×4
DRAPE DA VINCI XI COLUMN (DISPOSABLE) ×1
ELECT CAUTERY BLADE 6.4 (BLADE) IMPLANT
GAUZE 4X4 16PLY ~~LOC~~+RFID DBL (SPONGE) ×2 IMPLANT
GLOVE SURG ORTHO LTX SZ7.5 (GLOVE) ×16 IMPLANT
GOWN STRL REUS W/ TWL LRG LVL3 (GOWN DISPOSABLE) ×4 IMPLANT
GOWN STRL REUS W/TWL LRG LVL3 (GOWN DISPOSABLE) ×4
GRASPER SUT TROCAR 14GX15 (MISCELLANEOUS) IMPLANT
INFUSOR MANOMETER BAG 3000ML (MISCELLANEOUS) IMPLANT
IRRIGATION STRYKERFLOW (MISCELLANEOUS) ×1 IMPLANT
IRRIGATOR STRYKERFLOW (MISCELLANEOUS) ×2
IRRIGATOR SUCT 8 DISP DVNC XI (IRRIGATION / IRRIGATOR) IMPLANT
IRRIGATOR SUCTION 8MM XI DISP (IRRIGATION / IRRIGATOR)
IV NS IRRIG 3000ML ARTHROMATIC (IV SOLUTION) ×2 IMPLANT
KIT PINK PAD W/HEAD ARE REST (MISCELLANEOUS) ×2 IMPLANT
KIT PINK PAD W/HEAD ARM REST (MISCELLANEOUS) ×1 IMPLANT
KIT TURNOVER KIT A (KITS) ×2 IMPLANT
LABEL OR SOLS (LABEL) ×2 IMPLANT
MANIFOLD NEPTUNE II (INSTRUMENTS) ×2 IMPLANT
NEEDLE HYPO 22GX1.5 SAFETY (NEEDLE) ×2 IMPLANT
NEEDLE INSUFFLATION 14GA 120MM (NEEDLE) IMPLANT
NS IRRIG 500ML POUR BTL (IV SOLUTION) ×2 IMPLANT
PACK LAP CHOLECYSTECTOMY (MISCELLANEOUS) ×2 IMPLANT
PENCIL ELECTRO HAND CTR (MISCELLANEOUS) IMPLANT
POUCH SPECIMEN RETRIEVAL 10MM (ENDOMECHANICALS) ×4 IMPLANT
SEAL CANN UNIV 5-8 DVNC XI (MISCELLANEOUS) ×3 IMPLANT
SEAL XI 5MM-8MM UNIVERSAL (MISCELLANEOUS) ×3
SET TUBE FILTERED XL AIRSEAL (SET/KITS/TRAYS/PACK) ×2 IMPLANT
SOLUTION ELECTROLUBE (MISCELLANEOUS) ×2 IMPLANT
SUT MNCRL 4-0 (SUTURE) ×2
SUT MNCRL 4-0 27XMFL (SUTURE) ×2
SUT VICRYL 0 AB UR-6 (SUTURE) ×2 IMPLANT
SUTURE MNCRL 4-0 27XMF (SUTURE) ×2 IMPLANT
TROCAR Z-THREAD FIOS 11X100 BL (TROCAR) ×2 IMPLANT
WATER STERILE IRR 500ML POUR (IV SOLUTION) ×2 IMPLANT

## 2021-08-24 NOTE — Op Note (Signed)
Robotic cholecystectomy with Indocyamine Green Ductal Imaging.   Pre-operative Diagnosis: Acute calculus cholecystitis  Post-operative Diagnosis:  Same.  Procedure: Robotic assisted laparoscopic cholecystectomy with Indocyamine Green Ductal Imaging.   Surgeon: Ronny Bacon, M.D., FACS  Anesthesia: General. with endotracheal tube  Findings: edematous GB with discoloration, and no ICG uptake.   Estimated Blood Loss: 15 mL         Drains: None         Specimens: Gallbladder           Complications: none  Procedure Details  The patient was seen again in the Holding Room.  2.5 mg dose of ICG was administered intravenously.   The benefits, complications, treatment options, risks and expected outcomes were again reviewed with the patient. The likelihood of improving the patient's symptoms with return to their baseline status is good.  The patient and/or family concurred with the proposed plan, giving informed consent, again alternatives reviewed.  The patient was taken to Operating Room, identified, and the procedure verified as robotic assisted laparoscopic cholecystectomy.  Prior to the induction of general anesthesia, antibiotic prophylaxis was administered. VTE prophylaxis was in place. General endotracheal anesthesia was then administered and tolerated well. The patient was positioned in the supine position.  After the induction, the abdomen was prepped with Chloraprep and draped in the sterile fashion.  A Time Out was held and the above information confirmed.  Right infra-umbilical local infiltration with quarter percent Marcaine with epinephrine is utilized.  Made a 12 mm incision on the right periumbilical site, I advanced an optical 34mm port under direct visualization into the peritoneal cavity.  Once the peritoneum was penetrated, insufflation was initiated.  The trocar was then advanced into the abdominal cavity under direct visualization. Pneumoperitoneum was then continued  with Air seal utilizing CO2 at 15 mmHg or less and tolerated well without any adverse changes in the patient's vital signs.  Two 8.5-mm ports were placed in the left lower quadrant and laterally, and one to the right lower quadrant, all under direct vision. All skin incisions  were infiltrated with a local anesthetic agent before making the incision and placing the trocars.  The patient was positioned  in reverse Trendelenburg, tilted the patient's left side down.  Da Vinci XI robot was then positioned on to the patient's left side, and docked.  The gallbladder was identified, the fundus distended but grasped via the arm 4 Prograsp and retracted cephalad. Adhesions were lysed with scissors and cautery.  The infundibulum was identified grasped and retracted laterally, exposing the peritoneum overlying the triangle of Calot. This was then opened and dissected using cautery & scissors. An extended critical view of the cystic duct and cystic artery was obtained, aided by the ICG via FireFly which enabled ready visualization of the ductal anatomy.    The cystic duct was clearly identified and dissected to isolation.   Artery well isolated and clipped, and the cystic duct was triple clipped and divided with scissors, as close to the gallbladder neck as feasible, thus leaving two on the remaining stump.  The specimen side of the artery is sealed with bipolar and divided with monopolar scissors.   The gallbladder was taken from the gallbladder fossa in a retrograde fashion with the electrocautery. The gallbladder was removed and placed in an Endocatch bag.  The liver bed is inspected. Hemostasis was confirmed.  The robot was undocked and moved away from the operative field. Irrigation was utilized and was aspirated clear.  The gallbladder  and Endocatch sac were then removed through the infraumbilical port site.   Inspection of the right upper quadrant was performed. No bleeding, bile duct injury or leak, or bowel  injury was noted. The infra-umbilical port site fascia was closed with interrumpted 0 Vicryl sutures using PMI/cone under direct visualization. Pneumoperitoneum was released and ports removed.  4-0 subcuticular Monocryl was used to close the skin. Dermabond was  applied.  The patient was then extubated and brought to the recovery room in stable condition. Sponge, lap, and needle counts were correct at closure and at the conclusion of the case.               Ronny Bacon, M.D., [facility] 08/24/2021 5:04 PM

## 2021-08-24 NOTE — Transfer of Care (Signed)
Immediate Anesthesia Transfer of Care Note  Patient: Lisa Roach  Procedure(s) Performed: XI ROBOTIC ASSISTED LAPAROSCOPIC CHOLECYSTECTOMY INDOCYANINE GREEN FLUORESCENCE IMAGING (ICG)  Patient Location: PACU  Anesthesia Type:General  Level of Consciousness: drowsy  Airway & Oxygen Therapy: Patient Spontanous Breathing and Patient connected to face mask oxygen  Post-op Assessment: Report given to RN and Post -op Vital signs reviewed and stable  Post vital signs: Reviewed and stable  Last Vitals:  Vitals Value Taken Time  BP 115/69 08/24/21 1351  Temp 36.2 C 08/24/21 1349  Pulse 71 08/24/21 1356  Resp 25 08/24/21 1357  SpO2 100 % 08/24/21 1356  Vitals shown include unvalidated device data.  Last Pain:  Vitals:   08/24/21 1349  TempSrc:   PainSc: 0-No pain      Patients Stated Pain Goal: 0 (14/23/95 3202)  Complications: No notable events documented.

## 2021-08-24 NOTE — Anesthesia Procedure Notes (Signed)
Procedure Name: Intubation Date/Time: 08/24/2021 12:27 PM Performed by: Doreen Salvage, CRNA Pre-anesthesia Checklist: Patient identified, Patient being monitored, Timeout performed, Emergency Drugs available and Suction available Patient Re-evaluated:Patient Re-evaluated prior to induction Oxygen Delivery Method: Circle system utilized Preoxygenation: Pre-oxygenation with 100% oxygen Induction Type: IV induction Ventilation: Mask ventilation without difficulty Laryngoscope Size: Mac, 3 and McGraph Grade View: Grade I Tube type: Oral Tube size: 7.0 mm Number of attempts: 1 Airway Equipment and Method: Stylet Placement Confirmation: ETT inserted through vocal cords under direct vision, positive ETCO2 and breath sounds checked- equal and bilateral Secured at: 22 cm Tube secured with: Tape Dental Injury: Teeth and Oropharynx as per pre-operative assessment

## 2021-08-24 NOTE — Anesthesia Preprocedure Evaluation (Signed)
Anesthesia Evaluation  Patient identified by MRN, date of birth, ID band Patient awake    Reviewed: Allergy & Precautions, H&P , NPO status , Patient's Chart, lab work & pertinent test results, reviewed documented beta blocker date and time   History of Anesthesia Complications (+) PONV and history of anesthetic complications  Airway Mallampati: II  TM Distance: >3 FB Neck ROM: full    Dental  (+) Teeth Intact   Pulmonary neg pulmonary ROS,    Pulmonary exam normal        Cardiovascular Exercise Tolerance: Good hypertension, Normal cardiovascular exam+ Valvular Problems/Murmurs  Rhythm:regular Rate:Normal     Neuro/Psych negative neurological ROS  negative psych ROS   GI/Hepatic negative GI ROS, Neg liver ROS,   Endo/Other  negative endocrine ROS  Renal/GU negative Renal ROS  negative genitourinary   Musculoskeletal   Abdominal   Peds  Hematology negative hematology ROS (+)   Anesthesia Other Findings Past Medical History: No date: Heart murmur     Comment:  as a child No date: Hypertension No date: PONV (postoperative nausea and vomiting) Past Surgical History: 06/17/2018: BREAST BIOPSY; Left     Comment:  papilloma 08/28/2018: BREAST LUMPECTOMY; Left     Comment:  papilloma 08/28/2018: BREAST LUMPECTOMY WITH NEEDLE LOCALIZATION; Left     Comment:  Procedure: BREAST LUMPECTOMY WITH NEEDLE LOCALIZATION;                Surgeon: Benjamine Sprague, DO;  Location: [facility] ORS;  Service:              General;  Laterality: Left; No date: CESAREAN SECTION     Comment:  2006, 2010 BMI    Body Mass Index: 31.32 kg/m     Reproductive/Obstetrics negative OB ROS                             Anesthesia Physical Anesthesia Plan  ASA: 2  Anesthesia Plan: General ETT   Post-op Pain Management:    Induction:   PONV Risk Score and Plan: 4 or greater  Airway Management Planned:   Additional  Equipment:   Intra-op Plan:   Post-operative Plan:   Informed Consent: I have reviewed the patients History and Physical, chart, labs and discussed the procedure including the risks, benefits and alternatives for the proposed anesthesia with the patient or authorized representative who has indicated his/her understanding and acceptance.     Dental Advisory Given  Plan Discussed with: CRNA  Anesthesia Plan Comments:         Anesthesia Quick Evaluation

## 2021-08-24 NOTE — H&P (Signed)
[facility] SURGICAL HISTORY & PHYSICAL (cpt [phone])  HISTORY OF PRESENT ILLNESS (HPI):  50 y.o. female presented to [facility] ED yesterday for abdominal pain. Patient reports she was awoken from sleep at 0200 the day prior with severe epigastric abdominal pain. This was sharp in nature and radiate across her abdomen and to her back. She noted associated nausea and emesis. She tried a hot shower and going to work, but she had no relief from the pain. No fever, chills, cough, CP, SOB, juandice, or bowel changes. She denied any history of similar pain in the past. Only previous intra-abdominal surgeries is C-Section x2. Work up in the ED revealed a leukocytosis to 13.2K but labs were otherwise benign. RUQ [place] was concerning  for cholecystitis.   General surgery is consulted by emergency medicine physician Dr Carrie Mew, MD for evaluation and management of acute cholecystitis.   PAST MEDICAL HISTORY (PMH):  Past Medical History:  Diagnosis Date   Heart murmur    as a child   Hypertension    PONV (postoperative nausea and vomiting)     Reviewed. Otherwise negative.   PAST SURGICAL HISTORY ([place]):  Past Surgical History:  Procedure Laterality Date   BREAST BIOPSY Left 06/17/2018   papilloma   BREAST LUMPECTOMY Left 08/28/2018   papilloma   BREAST LUMPECTOMY WITH NEEDLE LOCALIZATION Left 08/28/2018   Procedure: BREAST LUMPECTOMY WITH NEEDLE LOCALIZATION;  Surgeon: Benjamine Sprague, DO;  Location: [facility] ORS;  Service: General;  Laterality: Left;   CESAREAN SECTION     2006, 2010    Reviewed. Otherwise negative.   MEDICATIONS:  Prior to Admission medications   Medication Sig Start Date End Date Taking? Authorizing Provider  lisinopril-hydrochlorothiazide (PRINZIDE,ZESTORETIC) 20-12.5 MG tablet Take 1 tablet by mouth daily.   Yes [provider]  Melatonin 3 MG TABS Take 1 tablet by mouth at bedtime.   Yes [provider]  montelukast (SINGULAIR) 10 MG tablet  Take 10 mg by mouth at bedtime. 04/10/21  Yes [provider]  venlafaxine XR (EFFEXOR-XR) 37.5 MG 24 hr capsule Take 37.5 mg by mouth daily. 07/09/21  Yes [provider]  buPROPion (WELLBUTRIN XL) 300 MG 24 hr tablet Take 300 mg by mouth daily. Patient not taking: No sig reported 06/19/21   [provider]     ALLERGIES:  No Known Allergies   SOCIAL HISTORY:  Social History   Socioeconomic History   Marital status: Married    Spouse name: Not on file   Number of children: Not on file   Years of education: Not on file   Highest education level: Not on file  Occupational History   Not on file  Tobacco Use   Smoking status: Never   Smokeless tobacco: Never  Vaping Use   Vaping Use: Never used  Substance and Sexual Activity   Alcohol use: Not Currently    Alcohol/week: 2.0 standard drinks    Types: 2 Cans of beer per week   Drug use: Never   Sexual activity: Not on file  Other Topics Concern   Not on file  Social History Narrative   Not on file   Social Determinants of Health   Financial Resource Strain: Not on file  Food Insecurity: Not on file  Transportation Needs: Not on file  Physical Activity: Not on file  Stress: Not on file  Social Connections: Not on file  Intimate Partner Violence: Not on file     FAMILY HISTORY:  Family History  Problem Relation Age of Onset   Breast cancer Neg Hx     Otherwise negative.   REVIEW OF SYSTEMS:  Review of Systems  Constitutional:  Negative for chills and fever.  HENT:  Negative for congestion and sore throat.   Respiratory:  Negative for cough and shortness of breath.   Cardiovascular:  Negative for chest pain and palpitations.  Gastrointestinal:  Positive for abdominal pain, nausea and vomiting. Negative for constipation and diarrhea.  Genitourinary:  Negative for dysuria and urgency.  All other systems reviewed and are negative.  VITAL SIGNS:  Temp:  [98 F (36.7 C)-98.3 F (36.8 C)]  98.3 F (36.8 C) (11/10 0724) Pulse Rate:  [64-74] 70 (11/10 0724) Resp:  [15-20] 18 (11/10 0724) BP: (122-176)/(71-96) 122/74 (11/10 0724) SpO2:  [96 %-98 %] 98 % (11/10 0724) Weight:  [90.7 kg] 90.7 kg (11/09 1950)     Height: 5\' 7"  (170.2 cm) Weight: 90.7 kg BMI (Calculated): 31.32   PHYSICAL EXAM:  Physical Exam Vitals and nursing note reviewed. Exam conducted with a chaperone present.  Constitutional:      General: She is not in acute distress.    Appearance: She is well-developed. She is not ill-appearing.  HENT:     Head: Normocephalic and atraumatic.  Eyes:     General: No scleral icterus.    Extraocular Movements: Extraocular movements intact.  Cardiovascular:     Rate and Rhythm: Normal rate and regular rhythm.     Heart sounds: Normal heart sounds.  Pulmonary:     Effort: Pulmonary effort is normal. No respiratory distress.     Breath sounds: Normal breath sounds.  Abdominal:     General: A surgical scar is present. There is no distension.     Palpations: Abdomen is soft.     Tenderness: There is abdominal tenderness (Mild) in the right upper quadrant. There is no guarding or rebound. Negative signs include Murphy's sign.  Genitourinary:    Comments: Deferred Skin:    General: Skin is warm and dry.     Coloration: Skin is not jaundiced or pale.  Neurological:     General: No focal deficit present.     Mental Status: She is alert and oriented to person, place, and time.  Psychiatric:        Mood and Affect: Mood normal.        Behavior: Behavior normal.    INTAKE/OUTPUT:  This shift: No intake/output data recorded.  Last 2 shifts: @IOLAST2SHIFTS @  Labs:  CBC Latest Ref Rng & Units 08/23/2021 08/28/2018 08/21/2018  WBC 4.0 - 10.5 K/uL 13.2(H) - 9.0  Hemoglobin 12.0 - 15.0 g/dL 13.5 12.6 12.2  Hematocrit 36.0 - 46.0 % 38.4 37.0 36.4  Platelets 150 - 400 K/uL 313 - 352   CMP Latest Ref Rng & Units 08/23/2021 08/28/2018  Glucose 70 - 99 mg/dL 147(H) 117(H)   BUN 6 - 20 mg/dL 10 -  Creatinine 0.44 - 1.00 mg/dL 0.76 -  Sodium 135 - 145 mmol/L 134(L) 140  Potassium 3.5 - 5.1 mmol/L 3.1(L) 3.4(L)  Chloride 98 - 111 mmol/L 104 -  CO2 22 - 32 mmol/L 20(L) -  Calcium 8.9 - 10.3 mg/dL 9.4 -  Total Protein 6.5 - 8.1 g/dL 7.9 -  Total Bilirubin 0.3 - 1.2 mg/dL 0.8 -  Alkaline Phos 38 - 126 U/L 80 -  AST 15 - 41 U/L 24 -  ALT 0 - 44 U/L 20 -     Imaging studies:  RUS [place] (08/23/2021) personally reviewed showing cholelithiasis with mild changes concerning for cholecystitis and radiologist report reviewed below:  IMPRESSION: 1. Cholelithiasis with equivocal findings for acute cholecystitis. A hepatobiliary scintigraphy may provide better evaluation of the gallbladder if there is a high clinical concern for acute cholecystitis . 2. Fatty liver.  Assessment/Plan: (ICD-10's: K81.0) 50 y.o. female with epigastric/RUQ abdominal pain and leukocytosis concerning for likely acute cholecystitis   - Admit to general surgery - Will plan on robotic assisted laparoscopic cholecystectomy this afternoon with Dr Christian Mate pending OR/Anesthesia availability - A/all risks, benefits, and alternatives to above procedure(s) were discussed with the patient, all of her questions were answered to her expressed satisfaction, patient expresses she wishes to proceed, and informed consent was obtained.    - NPO + IVF Resuscitation  - IV Abx (Rocephin) - Monitor abdominal examination - Pain control prn; antiemetics prn   - DVT prophylaxis; hold  All of the above findings and recommendations were discussed with the patient, and all of her questions were answered to her expressed satisfaction.  -- Edison Simon, PA-C [facility] 08/24/2021, 7:42 AM [phone] M-F: 7am - 4pm

## 2021-08-25 MED ORDER — CHLORHEXIDINE GLUCONATE CLOTH 2 % EX PADS
6.0000 | MEDICATED_PAD | Freq: Every day | CUTANEOUS | Status: DC
  Administered 2021-08-25: 6 via TOPICAL

## 2021-08-25 MED ORDER — MUPIROCIN 2 % EX OINT
1.0000 "application " | TOPICAL_OINTMENT | Freq: Two times a day (BID) | CUTANEOUS | Status: DC
  Administered 2021-08-25 (×2): 1 via NASAL
  Filled 2021-08-25: qty 22

## 2021-08-25 MED ORDER — HYDROCODONE-ACETAMINOPHEN 5-325 MG PO TABS: 1.0000 | ORAL_TABLET | Freq: Four times a day (QID) | ORAL | 0 refills | Status: DC | PRN

## 2021-08-25 MED ORDER — IBUPROFEN 600 MG PO TABS: 600.0000 mg | ORAL_TABLET | Freq: Four times a day (QID) | ORAL | 0 refills | Status: DC | PRN

## 2021-08-25 NOTE — Progress Notes (Signed)
Patient ambulated in the hallway without difficulty and has minimal pain.  Will continue to monitor.  Lisa Roach

## 2021-08-25 NOTE — Discharge Instructions (Signed)

## 2021-08-25 NOTE — Plan of Care (Signed)
Discharge teaching completed with patient who is in stable condition. 

## 2021-08-25 NOTE — Discharge Summary (Signed)
[facility] SURGICAL DISCHARGE SUMMARY  Patient ID: Lisa Roach MRN: [id number] DOB/AGE: 51/24/1972 51 y.o.  Admit date: 08/23/2021 Discharge date: 08/25/2021  Discharge Diagnoses Patient Active Problem List   Diagnosis Date Noted   Acute cholecystitis due to biliary calculus 08/24/2021   Essential hypertension 08/01/2016    Consultants None  Procedures 08/24/2021:  Robotic assisted laparoscopic cholecystectomy   HPI: 50 y.o. female presented to [facility] ED yesterday for abdominal pain. Patient reports she was awoken from sleep at 0200 the day prior with severe epigastric abdominal pain. This was sharp in nature and radiate across her abdomen and to her back. She noted associated nausea and emesis. She tried a hot shower and going to work, but she had no relief from the pain. No fever, chills, cough, CP, SOB, juandice, or bowel changes. She denied any history of similar pain in the past. Only previous intra-abdominal surgeries is C-Section x2. Work up in the ED revealed a leukocytosis to 13.2K but labs were otherwise benign. RUQ [place] was concerning  for cholecystitis.   Hospital Course: Informed consent was obtained and documented, and patient underwent uneventful robotic assisted laparoscopic cholecystectomy (Dr Christian Mate, 08/24/2021).  Post-operatively, patient's pain/symptoms improved/resolved and advancement of patient's diet and ambulation were well-tolerated. The remainder of patient's hospital course was essentially unremarkable, and discharge planning was initiated accordingly with patient safely able to be discharged home with appropriate discharge instructions, pain control, and outpatient follow-up after all of her questions were answered to her expressed satisfaction.   Discharge Condition: Good   Physical Examination:  Constitutional: Well appearing female, NAD Pulmonary: Normal effort, no respiratory distress Gastrointestinal: Soft, incisional soreness,  non-distended, no rebound/guarding Skin: Laparoscopic incisions are CDI with dermabond, no erythema or drainage    Allergies as of 08/25/2021   No Known Allergies      Medication List     TAKE these medications    buPROPion 300 MG 24 hr tablet Commonly known as: WELLBUTRIN XL Take 300 mg by mouth daily.   HYDROcodone-acetaminophen 5-325 MG tablet Commonly known as: NORCO/VICODIN Take 1 tablet by mouth every 6 (six) hours as needed for moderate pain or severe pain.   ibuprofen 600 MG tablet Commonly known as: ADVIL Take 1 tablet (600 mg total) by mouth every 6 (six) hours as needed.   lisinopril-hydrochlorothiazide 20-12.5 MG tablet Commonly known as: ZESTORETIC Take 1 tablet by mouth daily.   melatonin 3 MG Tabs tablet Take 1 tablet by mouth at bedtime.   montelukast 10 MG tablet Commonly known as: SINGULAIR Take 10 mg by mouth at bedtime.   venlafaxine XR 37.5 MG 24 hr capsule Commonly known as: EFFEXOR-XR Take 37.5 mg by mouth daily.          Follow-up Information     Tylene Fantasia, PA-C. Schedule an appointment as soon as possible for a visit in 2 week(s).   Specialty: Physician Assistant Why: s/p laparoscopic cholecystectomy.Marland KitchenMarland KitchenMarland KitchenMarland Kitchenokay to schedule after thanksgiving Contact information: [address] [place]  [address] [phone]                  Time spent on discharge management including discussion of hospital course, clinical condition, outpatient instructions, prescriptions, and follow up with the patient and members of the medical team: >30 minutes  -- Edison Simon , PA-C [facility]  08/25/2021, 8:36 AM [phone] M-F: 7am - 4pm

## 2021-08-28 LAB — SURGICAL PATHOLOGY

## 2021-08-29 NOTE — Anesthesia Postprocedure Evaluation (Signed)
Anesthesia Post Note  Patient: Lisa Roach  Procedure(s) Performed: XI ROBOTIC ASSISTED LAPAROSCOPIC CHOLECYSTECTOMY INDOCYANINE GREEN FLUORESCENCE IMAGING (ICG)  Patient location during evaluation: PACU Anesthesia Type: General Level of consciousness: awake and alert Pain management: pain level controlled Vital Signs Assessment: post-procedure vital signs reviewed and stable Respiratory status: spontaneous breathing, nonlabored ventilation, respiratory function stable and patient connected to nasal cannula oxygen Cardiovascular status: blood pressure returned to baseline and stable Postop Assessment: no apparent nausea or vomiting Anesthetic complications: no   No notable events documented.   Last Vitals:  Vitals:   08/25/21 0544 08/25/21 0753  BP: 130/66 121/71  Pulse: 65 62  Resp: 18 18  Temp: 36.5 C 37 C  SpO2: 97% 100%    Last Pain:  Vitals:   08/25/21 0945  TempSrc:   PainSc: Lisa Roach Lisa Roach

## 2021-09-14 ENCOUNTER — Encounter: Payer: Self-pay | Admitting: Physician Assistant

## 2021-09-14 ENCOUNTER — Ambulatory Visit (INDEPENDENT_AMBULATORY_CARE_PROVIDER_SITE_OTHER): Payer: BC Managed Care – PPO | Admitting: Physician Assistant

## 2021-09-14 ENCOUNTER — Other Ambulatory Visit: Payer: Self-pay

## 2021-09-14 VITALS — BP 164/89 | HR 59 | Temp 97.8°F | Ht 67.0 in | Wt 210.8 lb

## 2021-09-14 DIAGNOSIS — K8000 Calculus of gallbladder with acute cholecystitis without obstruction: Secondary | ICD-10-CM

## 2021-09-14 DIAGNOSIS — Z09 Encounter for follow-up examination after completed treatment for conditions other than malignant neoplasm: Secondary | ICD-10-CM

## 2021-09-14 NOTE — Progress Notes (Signed)
[facility] POST-OP OFFICE VISIT  09/14/2021  HPI: Lisa Roach is a 50 y.o. female 21 days s/p robotic assisted laparoscopic cholecystectomy for acute cholecystitis with Dr Christian Mate   She has done very well since surgery.  No issues with fever, chills, nausea, emesis, or abdominal pain She reports that her bowel movements are "better than before" No issues with incisions No other complaints  Vital signs: BP (!) 164/89   Pulse (!) 59   Temp 97.8 F (36.6 C) (Oral)   Ht 5\' 7"  (1.702 m)   Wt 210 lb 12.8 oz (95.6 kg)   SpO2 97%   BMI 33.02 kg/m    Physical Exam: Constitutional: Well appearing female, NAD Abdomen: Soft, non-tender, non-distended, no rebound/guarding Skin: Laparoscopic incisions are well healed  Assessment/Plan: This is a 50 y.o. female 21 days s/p robotic assisted laparoscopic cholecystectomy for acute cholecystitis   - Pain control prn  - Reviewed lifting restrictions; 1 more week  - Reviewed surgical pathology: Cholelithiasis, acute on chronic cholecystitis   - She can rtc on as needed basis   -- Edison Simon, PA-C [facility] 09/14/2021, 10:11 AM [phone] M-F: 7am - 4pm

## 2021-09-14 NOTE — Patient Instructions (Signed)
If you have any concerns or questions, please feel free to call our office.  GENERAL POST-OPERATIVE PATIENT INSTRUCTIONS   WOUND CARE INSTRUCTIONS:  Keep a dry clean dressing on the wound if there is drainage. The initial bandage may be removed after 24 hours.  Once the wound has quit draining you may leave it open to air.  If clothing rubs against the wound or causes irritation and the wound is not draining you may cover it with a dry dressing during the daytime.  Try to keep the wound dry and avoid ointments on the wound unless directed to do so.  If the wound becomes bright red and painful or starts to drain infected material that is not clear, please contact your physician immediately.  If the wound is mildly pink and has a thick firm ridge underneath it, this is normal, and is referred to as a healing ridge.  This will resolve over the next 4-6 weeks.  BATHING: You may shower if you have been informed of this by your surgeon. However, Please do not submerge in a tub, hot tub, or pool until incisions are completely sealed or have been told by your surgeon that you may do so.  DIET:  You may eat any foods that you can tolerate.  It is a good idea to eat a high fiber diet and take in plenty of fluids to prevent constipation.  If you do become constipated you may want to take a mild laxative or take ducolax tablets on a daily basis until your bowel habits are regular.  Constipation can be very uncomfortable, along with straining, after recent surgery.  ACTIVITY:  You are encouraged to cough and deep breath or use your incentive spirometer if you were given one, every 15-30 minutes when awake.  This will help prevent respiratory complications and low grade fevers post-operatively if you had a general anesthetic.  You may want to hug a pillow when coughing and sneezing to add additional support to the surgical area, if you had abdominal or chest surgery, which will decrease pain during these times.  You  are encouraged to walk and engage in light activity for the next two weeks.  You should not lift more than 10-15 pounds, until 09/21/2021 as it could put you at increased risk for complications.  Twenty pounds is roughly equivalent to a plastic bag of groceries. At that time- Listen to your body when lifting, if you have pain when lifting, stop and then try again in a few days. Soreness after doing exercises or activities of daily living is normal as you get back in to your normal routine.  MEDICATIONS:  Try to take narcotic medications and anti-inflammatory medications, such as tylenol, ibuprofen, naprosyn, etc., with food.  This will minimize stomach upset from the medication.  Should you develop nausea and vomiting from the pain medication, or develop a rash, please discontinue the medication and contact your physician.  You should not drive, make important decisions, or operate machinery when taking narcotic pain medication.  SUNBLOCK Use sun block to incision area over the next year if this area will be exposed to sun. This helps decrease scarring and will allow you avoid a permanent darkened area over your incision.     Gallbladder Eating Plan If you have a gallbladder condition, you may have trouble digesting fats. Eating a low-fat diet can help reduce your symptoms, and may be helpful before and after having surgery to remove your gallbladder (cholecystectomy). Your health  care provider may recommend that you work with a diet and nutrition specialist (dietitian) to help you reduce the amount of fat in your diet. What are tips for following this plan? General guidelines Limit your fat intake to less than 30% of your total daily calories. If you eat around 1,800 calories each day, this is less than 60 grams (g) of fat per day. Fat is an important part of a healthy diet. Eating a low-fat diet can make it hard to maintain a healthy body weight. Ask your dietitian how much fat, calories, and other  nutrients you need each day. Eat small, frequent meals throughout the day instead of three large meals. Drink at least 8-10 cups of fluid a day. Drink enough fluid to keep your urine clear or pale yellow. Limit alcohol intake to no more than 1 drink a day for nonpregnant women and 2 drinks a day for men. One drink equals 12 oz of beer, 5 oz of wine, or 1 oz of hard liquor. Reading food labels  Check Nutrition Facts on food labels for the amount of fat per serving. Choose foods with less than 3 grams of fat per serving. Shopping Choose nonfat and low-fat healthy foods. Look for the words "nonfat," "low fat," or "fat free." Avoid buying processed or prepackaged foods. Cooking Cook using low-fat methods, such as baking, broiling, grilling, or boiling. Cook with small amounts of healthy fats, such as olive oil, grapeseed oil, canola oil, or sunflower oil. What foods are recommended? All fresh, frozen, or canned fruits and vegetables. Whole grains. Low-fat or non-fat (skim) milk and yogurt. Lean meat, skinless poultry, fish, eggs, and beans. Low-fat protein supplement powders or drinks. Spices and herbs. What foods are not recommended? High-fat foods. These include baked goods, fast food, fatty cuts of meat, ice cream, french toast, sweet rolls, pizza, cheese bread, foods covered with butter, creamy sauces, or cheese. Fried foods. These include french fries, tempura, battered fish, breaded chicken, fried breads, and sweets. Foods with strong odors. Foods that cause bloating and gas. Summary A low-fat diet can be helpful if you have a gallbladder condition, or before and after gallbladder surgery. Limit your fat intake to less than 30% of your total daily calories. This is about 60 g of fat if you eat 1,800 calories each day. Eat small, frequent meals throughout the day instead of three large meals. This information is not intended to replace advice given to you by your health care provider.  Make sure you discuss any questions you have with your health care provider. Document Revised: 05/13/2020 Document Reviewed: 05/19/2020 Elsevier Patient Education  2022 [employer].

## 2022-06-22 ENCOUNTER — Other Ambulatory Visit: Payer: Self-pay | Admitting: Student

## 2022-06-22 DIAGNOSIS — Z1231 Encounter for screening mammogram for malignant neoplasm of breast: Secondary | ICD-10-CM

## 2022-07-20 ENCOUNTER — Ambulatory Visit
Admission: RE | Admit: 2022-07-20 | Discharge: 2022-07-20 | Disposition: A | Discharge status: Home / Self Care | Payer: BC Managed Care – PPO | Source: Ambulatory Visit | Attending: Student | Admitting: Student

## 2022-07-20 DIAGNOSIS — Z1231 Encounter for screening mammogram for malignant neoplasm of breast: Secondary | ICD-10-CM | POA: Insufficient documentation

## 2023-07-30 ENCOUNTER — Other Ambulatory Visit: Payer: Self-pay | Admitting: Student

## 2023-07-30 DIAGNOSIS — Z1231 Encounter for screening mammogram for malignant neoplasm of breast: Secondary | ICD-10-CM

## 2023-08-06 ENCOUNTER — Ambulatory Visit
Admission: RE | Admit: 2023-08-06 | Discharge: 2023-08-06 | Disposition: A | Discharge status: Home / Self Care | Payer: BC Managed Care – PPO | Source: Ambulatory Visit | Attending: Student | Admitting: Student

## 2023-08-06 DIAGNOSIS — Z1231 Encounter for screening mammogram for malignant neoplasm of breast: Secondary | ICD-10-CM

## 2024-07-23 ENCOUNTER — Ambulatory Visit (INDEPENDENT_AMBULATORY_CARE_PROVIDER_SITE_OTHER)

## 2024-07-23 DIAGNOSIS — L821 Other seborrheic keratosis: Secondary | ICD-10-CM | POA: Diagnosis not present

## 2024-07-23 DIAGNOSIS — Z1283 Encounter for screening for malignant neoplasm of skin: Secondary | ICD-10-CM | POA: Diagnosis not present

## 2024-07-23 DIAGNOSIS — D1801 Hemangioma of skin and subcutaneous tissue: Secondary | ICD-10-CM | POA: Diagnosis not present

## 2024-07-23 DIAGNOSIS — W908XXA Exposure to other nonionizing radiation, initial encounter: Secondary | ICD-10-CM

## 2024-07-23 DIAGNOSIS — L814 Other melanin hyperpigmentation: Secondary | ICD-10-CM

## 2024-07-23 DIAGNOSIS — D229 Melanocytic nevi, unspecified: Secondary | ICD-10-CM

## 2024-07-23 DIAGNOSIS — L578 Other skin changes due to chronic exposure to nonionizing radiation: Secondary | ICD-10-CM

## 2024-07-23 NOTE — Progress Notes (Signed)
    Subjective   Lisa Roach is a 53 y.o. female who presents for the following: Total body skin exam for skin cancer screening and mole check. The patient has spots, moles and lesions to be evaluated, some may be new or changing and the patient may have concern these could be cancer.  Patient is new patient.  Today patient reports: No personal or family hx of skin cancer, lesion at left ear.   Review of Systems:    No other skin or systemic complaints except as noted in HPI or Assessment and Plan.  The following portions of the chart were reviewed this encounter and updated as appropriate: medications, allergies, medical history  Relevant Medical History:  n/a   Objective  Well appearing patient in no apparent distress; mood and affect are within normal limits. Examination was performed of the: Full Skin Examination: scalp, head, eyes, ears, nose, lips, neck, chest, axillae, abdomen, back, buttocks, bilateral upper extremities, bilateral lower extremities, hands, feet, fingers, toes, fingernails, and toenails.   Examination notable for: SKIN EXAM, Angioma(s): Scattered red vascular papule(s)  , Lentigo/lentigines: Scattered pigmented macules that are tan to brown in color and are somewhat non-uniform in shape and concentrated in the sun-exposed areas, Nevus/nevi: Scattered well-demarcated, regular, pigmented macule(s) and/or papule(s)  , Seborrheic Keratosis(es): Stuck-on appearing keratotic papule(s) on the trunk, some  irritated with redness, crusting, edema, and/or partial avulsion, Actinic Damage/Elastosis: chronic sun damage: dyspigmentation, telangiectasia, and wrinkling  Examination limited by: Undergarments and Patient deferred removal       Assessment & Plan   SKIN CANCER SCREENING PERFORMED TODAY.  BENIGN SKIN FINDINGS  - Lentigines  - Seborrheic keratoses  - Hemangiomas   - Nevus/Multiple Benign Nevi  - Reassurance provided regarding the benign appearance of lesions noted  on exam today; no treatment is indicated in the absence of symptoms/changes. - Reinforced importance of photoprotective strategies including liberal and frequent sunscreen use of a broad-spectrum SPF 30 or greater, use of protective clothing, and sun avoidance for prevention of cutaneous malignancy and photoaging.  Counseled patient on the importance of regular self-skin monitoring as well as routine clinical skin examinations as scheduled.   ACTINIC DAMAGE - Chronic condition, secondary to cumulative UV/sun exposure - Recommend daily broad spectrum sunscreen SPF 30+ to sun-exposed areas, reapply every 2 hours as needed.  - Staying in the shade or wearing long sleeves, sun glasses (UVA+UVB protection) and wide brim hats (4-inch brim around the entire circumference of the hat) are also recommended for sun protection.  - Call for new or changing lesions.   Level of service outlined above   Procedures, orders, diagnosis for this visit:    There are no diagnoses linked to this encounter.  Return to clinic: Return in about 1 year (around 07/23/2025) for TBSE, w/ Dr. Raymund.  Documentation:  I, Jacquelynn V. Wilfred, CMA, am acting as scribe for Lauraine JAYSON Raymund, MD .  I have reviewed the above documentation for accuracy and completeness, and I agree with the above.  Lauraine JAYSON Raymund, MD

## 2024-07-23 NOTE — Patient Instructions (Addendum)
 - [address]  - Elta MD (tinted ones), Eucerin tinted sunscreen is a good dupe (oily but gives a nice glow).  - La Roche Cicloplast      Sunscreen  Who needs sunscreen? Everyone. Sunscreen use can help prevent skin cancer by protecting you from the sun's harmful ultraviolet rays. Anyone can get skin cancer, regardless of age, gender or race. In fact, it is estimated that one in five Americans will develop skin cancer in their lifetime.  Sunscreen alone cannot fully protect you. In addition to wearing sunscreen, dermatologists recommend taking the following steps to protect your skin and find skin cancer early:  Seek shade when appropriate, remembering that the sun's rays are strongest between 10 a.m. and 2 p.m. If your shadow is shorter than you are, seek shade. Dress to protect yourself from the sun by wearing a lightweight long-sleeved shirt, pants, a wide-brimmed hat and sunglasses, when possible.  Use extra caution near water, snow and sand as they reflect the damaging rays of the sun, which can increase your chance of sunburn.  Get vitamin D safely through a healthy diet that may include vitamin supplements. Don't seek the sun. Avoid tanning beds. Ultraviolet light from the sun and tanning beds can cause skin cancer and wrinkling. If you want to look tan, you may wish to use a self-tanning product, but continue to use sunscreen with it.  When should I use sunscreen? Every day you go outside--even if you're just walking to and from your form of transportation. The sun emits harmful UV rays year-round. Even on cloudy days, up to 80 percent of the sun's harmful UV rays can penetrate your skin. Snow, sand and water increase the need for sunscreen because they reflect the sun's rays.  How much sunscreen should I use, and how often should I apply it? Most people only apply 25-50 percent of the recommended amount of sunscreen. Apply enough sunscreen to cover all exposed skin. Most adults need  about 1 ounce -- or enough to fill a shot glass -- to fully cover their body.  Don't forget to apply to the tops of your feet, your neck, your ears and the top of your head. Apply sunscreen to dry skin 15 minutes before going outdoors.  Skin cancer also can form on the lips. To protect your lips, apply a lip balm or lipstick that contains sunscreen with an SPF of 30 or higher.  When outdoors, reapply sunscreen approximately every two hours, or after swimming or sweating, according to the directions on the bottle.   Broad-spectrum sunscreens protect against both UVA and UVB rays. What is the difference between the rays? Sunlight consists of two types of harmful rays that reach the earth -- UVA rays and UVB rays. Overexposure to either can lead to skin cancer. In addition to causing skin cancer, here's what each of these rays do:  UVA rays (or aging rays) can prematurely age your skin, causing wrinkles and age spots, and can pass through window glass. UVB rays (or burning rays) are the primary cause of sunburn and are blocked by window glass  There is no safe way to tan. Every time you tan, you damage your skin. As this damage builds, you speed up the aging of your skin and increase your risk for all types of skin cancer.  What is the difference between chemical and physical sunscreens? Chemical sunscreens work like a sponge, absorbing the sun's rays. They contain one or more of the following active  ingredients: oxybenzone, avobenzone, octisalate, octocrylene, homosalate and octinoxate. These formulations tend to be easier to rub into the skin without leaving a white residue.   Physical sunscreens work like a shield, sitting sit on the surface of your skin and deflecting the sun's rays. They contain the active ingredients zinc oxide and/or titanium dioxide. Use this sunscreen if you have sensitive skin.   What type of sunscreen should I use? The best type of sunscreen is the one you will use again and  again. Just make sure it offers broad-spectrum (UVA and UVB) protection, has an SPF of 30+, and is water-resistant. The kind of sunscreen you use is a matter of personal choice, and may vary depending on the area of the body to be protected. Available sunscreen options include lotions, creams, gels, ointments, wax sticks and sprays.  Recommended physical sunscreens for face: - Neutrogena Sheer Zinc - Aveeno Positively Mineral Sensitive - CeraVe Hydrating Mineral (also has a tinted version) - La Roche-Posay Anthelios Mineral Face (comes as a cream, lotion, light fluid, and there is also a tinted version).  - EltaMD UV Clear (also has a tinted version)  Recommended physical sunscreens for body: - Neutrogena Sheer Zinc Dry-Touch Sunscreen Sensitive Skin Lotion Broad Spectrum SPF 50 - Aveeno Positively Mineral Sensitive Skin Sunscreen Broad Spectrum SPF 50 - La Roche-Posay Anthelios SPF 50 Mineral Sunscreen - Gentle Lotion - CeraVe Hydrating Mineral Sunscreen SPF 50  Recommended chemical sunscreens for face: - Anthelios UV Correct Face Sunscreen SPF 70 with Niacinamide - Neutrogena Clear Face Oil-Free SPF 50 with Helioplex - Neutrogena Sport Face Oil-Free SPF 70+ with Helioplex - Aveeno Protect + Hydrate Sunscreen For Face SPF 70 - La Roche-Posay Anthelios Light Fluid Sunscreen for Face SPF 60  Recommended chemical sunscreens for body: - Neutrogena Ultra Sheer Dry-Touch Sunscreen SPF 70 - Aveeno Protect + Hydrate Broad Spectrum All-Day Hydration SPF 60 (comes in a big pump) - La Roche-Posay Anthelios Melt-In Milk Sunscreen SPF 60  Melanoma ABCDEs  Melanoma is the most dangerous type of skin cancer, and is the leading cause of death from skin disease.  You are more likely to develop melanoma if you: Have light-colored skin, light-colored eyes, or red or blond hair Spend a lot of time in the sun Tan regularly, either outdoors or in a tanning bed Have had blistering sunburns, especially  during childhood Have a close family member who has had a melanoma Have atypical moles or large birthmarks  Early detection of melanoma is key since treatment is typically straightforward and cure rates are extremely high if we catch it early.   The first sign of melanoma is often a change in a mole or a new dark spot.  The ABCDE system is a way of remembering the signs of melanoma.  A for asymmetry:  The two halves do not match. B for border:  The edges of the growth are irregular. C for color:  A mixture of colors are present instead of an even brown color. D for diameter:  Melanomas are usually (but not always) greater than 6mm - the size of a pencil eraser. E for evolution:  The spot keeps changing in size, shape, and color.  Please check your skin once per month between visits. You can use a small mirror in front and a large mirror behind you to keep an eye on the back side or your body.   If you see any new or changing lesions before your next follow-up, please call to  schedule a visit.  Please continue daily skin protection including broad spectrum sunscreen SPF 30+ to sun-exposed areas, reapplying every 2 hours as needed when you're outdoors.   Staying in the shade or wearing long sleeves, sun glasses (UVA+UVB protection) and wide brim hats (4-inch brim around the entire circumference of the hat) are also recommended for sun protection.   Due to recent changes in healthcare laws, you may see results of your pathology and/or laboratory studies on MyChart before the doctors have had a chance to review them. We understand that in some cases there may be results that are confusing or concerning to you. Please understand that not all results are received at the same time and often the doctors may need to interpret multiple results in order to provide you with the best plan of care or course of treatment. Therefore, we ask that you please give us  2 business days to thoroughly review all your  results before contacting the office for clarification. Should we see a critical lab result, you will be contacted sooner.   If You Need Anything After Your Visit  If you have any questions or concerns for your doctor, please call our main line at [phone] and press option 4 to reach your doctor's medical assistant. If no one answers, please leave a voicemail as directed and we will return your call as soon as possible. Messages left after 4 pm will be answered the following business day.   You may also send us  a message via MyChart. We typically respond to MyChart messages within 1-2 business days.  For prescription refills, please ask your pharmacy to contact our office. Our fax number is [phone].  If you have an urgent issue when the clinic is closed that cannot wait until the next business day, you can page your doctor at the number below.    Please note that while we do our best to be available for urgent issues outside of office hours, we are not available 24/7.   If you have an urgent issue and are unable to reach us , you may choose to seek medical care at your doctor's office, retail clinic, urgent care center, or emergency room.  If you have a medical emergency, please immediately call 911 or go to the emergency department.  Pager Numbers  - Dr. Hester: [phone]  - Dr. Jackquline: [phone]  - Dr. Claudene: [phone]   In the event of inclement weather, please call our main line at [phone] for an update on the status of any delays or closures.  Dermatology Medication Tips: Please keep the boxes that topical medications come in in order to help keep track of the instructions about where and how to use these. Pharmacies typically print the medication instructions only on the boxes and not directly on the medication tubes.   If your medication is too expensive, please contact our office at [phone] option 4 or send us  a message through MyChart.   We are  unable to tell what your co-pay for medications will be in advance as this is different depending on your insurance coverage. However, we may be able to find a substitute medication at lower cost or fill out paperwork to get insurance to cover a needed medication.   If a prior authorization is required to get your medication covered by your insurance company, please allow us  1-2 business days to complete this process.  Drug prices often vary depending on where the prescription is filled and some pharmacies may  offer cheaper prices.  The website www.goodrx.com contains coupons for medications through different pharmacies. The prices here do not account for what the cost may be with help from insurance (it may be cheaper with your insurance), but the website can give you the price if you did not use any insurance.  - You can print the associated coupon and take it with your prescription to the pharmacy.  - You may also stop by our office during regular business hours and pick up a GoodRx coupon card.  - If you need your prescription sent electronically to a different pharmacy, notify our office through [facility] or by phone at [phone] option 4.     Si Usted Necesita Algo Despus de Su Visita  Tambin puede enviarnos un mensaje a travs de Clinical cytogeneticist. Por lo general respondemos a los mensajes de MyChart en el transcurso de 1 a 2 das hbiles.  Para renovar recetas, por favor pida a su farmacia que se ponga en contacto con nuestra oficina. Randi lakes de fax es [place] [phone].  Si tiene un asunto urgente cuando la clnica est cerrada y que no puede esperar hasta el siguiente da hbil, puede llamar/localizar a su doctor(a) al nmero que aparece a continuacin.   Por favor, tenga en cuenta que aunque hacemos todo lo posible para estar disponibles para asuntos urgentes fuera del horario de [place], no estamos disponibles las 24 horas del da, los 7 [address]  [address] de la [place].   Si tiene un  problema urgente y no puede comunicarse con nosotros, puede optar por buscar atencin mdica  en el consultorio de su doctor(a), en una clnica privada, en un centro de atencin urgente o en una sala de emergencias.  Si tiene Engineer, drilling, por favor llame inmediatamente al 911 o vaya a la sala de emergencias.  Nmeros de bper  - Dr. Hester: [phone]  - Dra. Jackquline: [phone]  - Dr. Claudene: [phone]   En caso de inclemencias del tiempo, por favor llame a landry capes principal al [phone] para una actualizacin sobre el [place] de cualquier retraso o cierre.  Consejos para la medicacin en dermatologa: Por favor, guarde las cajas en las que vienen los medicamentos de uso tpico para ayudarle a seguir las instrucciones sobre dnde y cmo usarlos. Las farmacias generalmente imprimen las instrucciones del medicamento slo en las cajas y no directamente en los tubos del [place].   Si su medicamento es muy caro, por favor, pngase en contacto con landry rieger llamando al [phone] y presione la opcin 4 o envenos un mensaje a travs de Clinical cytogeneticist.   No podemos decirle cul ser su copago por los medicamentos por adelantado ya que esto es diferente dependiendo de la cobertura de su seguro. Sin embargo, es posible que podamos encontrar un medicamento sustituto a Audiological scientist un formulario para que el seguro cubra el medicamento que se considera necesario.   Si se requiere una autorizacin previa para que su compaa de seguros malta su medicamento, por favor permtanos de 1 a 2 das hbiles para completar este proceso.  Los precios de los medicamentos varan con frecuencia dependiendo del Environmental consultant de dnde se surte la receta y alguna farmacias pueden ofrecer precios ms baratos.  El sitio web www.goodrx.com tiene cupones para medicamentos de Health and safety inspector. Los precios aqu no tienen en cuenta lo que podra costar con la ayuda del seguro (puede ser ms  barato con su seguro), pero el sitio web puede darle el precio si no  utiliz ningn seguro.  - Puede imprimir el cupn correspondiente y llevarlo con su receta a la farmacia.  - Tambin puede pasar por nuestra oficina durante el horario de atencin regular y Education officer, museum una tarjeta de cupones de GoodRx.  - Si necesita que su receta se enve electrnicamente a una farmacia diferente, informe a nuestra oficina a travs de MyChart de [place] o por telfono llamando al [phone] y presione la opcin 4.

## 2024-08-05 ENCOUNTER — Other Ambulatory Visit: Payer: Self-pay | Admitting: Student

## 2024-08-05 DIAGNOSIS — Z1231 Encounter for screening mammogram for malignant neoplasm of breast: Secondary | ICD-10-CM

## 2024-08-12 ENCOUNTER — Ambulatory Visit
Admission: RE | Admit: 2024-08-12 | Discharge: 2024-08-12 | Disposition: A | Discharge status: Home / Self Care | Source: Ambulatory Visit | Attending: Student | Admitting: Student

## 2024-08-12 DIAGNOSIS — Z1231 Encounter for screening mammogram for malignant neoplasm of breast: Secondary | ICD-10-CM

## 2025-07-26 ENCOUNTER — Ambulatory Visit
